# Patient Record
Sex: Male | Born: 1975 | Hispanic: Yes | Smoking: Current every day smoker
Health system: Southern US, Community
[De-identification: ages and names within clinical notes are randomized; demographics above are authoritative.]

## PROBLEM LIST (undated history)

## (undated) DIAGNOSIS — F32A Depression, unspecified: Secondary | ICD-10-CM

## (undated) DIAGNOSIS — M5020 Other cervical disc displacement, unspecified cervical region: Secondary | ICD-10-CM

## (undated) DIAGNOSIS — G709 Myoneural disorder, unspecified: Secondary | ICD-10-CM

## (undated) DIAGNOSIS — F419 Anxiety disorder, unspecified: Secondary | ICD-10-CM

## (undated) HISTORY — DX: Anxiety disorder, unspecified: F41.9

## (undated) HISTORY — DX: Myoneural disorder, unspecified: G70.9

## (undated) HISTORY — DX: Depression, unspecified: F32.A

---

## 2020-08-13 ENCOUNTER — Emergency Department: Payer: Medicare HMO

## 2020-08-13 ENCOUNTER — Encounter: Payer: Self-pay | Admitting: Intensive Care

## 2020-08-13 ENCOUNTER — Other Ambulatory Visit: Payer: Self-pay

## 2020-08-13 ENCOUNTER — Emergency Department
Admission: EM | Admit: 2020-08-13 | Discharge: 2020-08-13 | Disposition: A | Discharge status: Home / Self Care | Payer: Medicare HMO | Attending: Emergency Medicine | Admitting: Emergency Medicine

## 2020-08-13 DIAGNOSIS — F17210 Nicotine dependence, cigarettes, uncomplicated: Secondary | ICD-10-CM | POA: Diagnosis not present

## 2020-08-13 DIAGNOSIS — W19XXXA Unspecified fall, initial encounter: Secondary | ICD-10-CM | POA: Insufficient documentation

## 2020-08-13 DIAGNOSIS — M5416 Radiculopathy, lumbar region: Secondary | ICD-10-CM | POA: Insufficient documentation

## 2020-08-13 DIAGNOSIS — M545 Low back pain, unspecified: Secondary | ICD-10-CM | POA: Diagnosis present

## 2020-08-13 MED ORDER — METHOCARBAMOL 750 MG PO TABS: 750.0000 mg | ORAL_TABLET | Freq: Four times a day (QID) | ORAL | 0 refills | Status: AC | PRN

## 2020-08-13 MED ORDER — DEXAMETHASONE SODIUM PHOSPHATE 10 MG/ML IJ SOLN
10.0000 mg | Freq: Once | INTRAMUSCULAR | Status: AC
  Administered 2020-08-13: 10 mg via INTRAMUSCULAR
  Filled 2020-08-13: qty 1

## 2020-08-13 MED ORDER — METHOCARBAMOL 500 MG PO TABS
750.0000 mg | ORAL_TABLET | Freq: Once | ORAL | Status: AC
  Administered 2020-08-13: 750 mg via ORAL
  Filled 2020-08-13: qty 2

## 2020-08-13 MED ORDER — OXYCODONE-ACETAMINOPHEN 5-325 MG PO TABS
1.0000 | ORAL_TABLET | Freq: Once | ORAL | Status: AC
  Administered 2020-08-13: 1 via ORAL
  Filled 2020-08-13: qty 1

## 2020-08-13 MED ORDER — PREDNISONE 10 MG PO TABS: ORAL_TABLET | ORAL | 0 refills | Status: AC

## 2020-08-13 MED ORDER — ACETAMINOPHEN 325 MG PO TABS
650.0000 mg | ORAL_TABLET | Freq: Once | ORAL | Status: AC
  Administered 2020-08-13: 650 mg via ORAL
  Filled 2020-08-13: qty 2

## 2020-08-13 NOTE — ED Notes (Signed)
Pt states he fell yesterday bc his "leg gave out." Pt c/o lower back pain radiating into left leg. Pt able to ambulate with observed difficulty.

## 2020-08-13 NOTE — ED Triage Notes (Signed)
Patient c/o chronic lower back pain from injury in 2018. Reports driving here from [place] two days ago and his back has been hurting ever since

## 2020-08-13 NOTE — Discharge Instructions (Addendum)
Please take the steroid taper and muscle relaxant as prescribed.  You may also use Tylenol, up to 1000 mg 4 times daily as needed for pain.  You have also been referred to pain management to establish with their practice locally here if you are going to live in this area.  You have also been referred to neurosurgery for follow-up regarding your chronic pain.  Return to the emergency department if you experience any worsening in your symptoms, particularly if you have any loss of control of your bladder or bowels or any fevers.

## 2020-08-15 ENCOUNTER — Emergency Department
Admission: EM | Admit: 2020-08-15 | Discharge: 2020-08-15 | Disposition: A | Discharge status: Home / Self Care | Payer: Medicare HMO | Attending: Emergency Medicine | Admitting: Emergency Medicine

## 2020-08-15 ENCOUNTER — Other Ambulatory Visit: Payer: Self-pay

## 2020-08-15 DIAGNOSIS — F17210 Nicotine dependence, cigarettes, uncomplicated: Secondary | ICD-10-CM | POA: Insufficient documentation

## 2020-08-15 DIAGNOSIS — M545 Low back pain, unspecified: Secondary | ICD-10-CM | POA: Insufficient documentation

## 2020-08-15 DIAGNOSIS — G8929 Other chronic pain: Secondary | ICD-10-CM | POA: Insufficient documentation

## 2020-08-15 MED ORDER — ORPHENADRINE CITRATE 30 MG/ML IJ SOLN
60.0000 mg | INTRAMUSCULAR | Status: AC
  Administered 2020-08-15: 60 mg via INTRAMUSCULAR
  Filled 2020-08-15: qty 2

## 2020-08-15 MED ORDER — OXYCODONE-ACETAMINOPHEN 5-325 MG PO TABS
1.0000 | ORAL_TABLET | Freq: Once | ORAL | Status: AC
  Administered 2020-08-15: 1 via ORAL
  Filled 2020-08-15: qty 1

## 2020-08-15 MED ORDER — KETOROLAC TROMETHAMINE 30 MG/ML IJ SOLN
30.0000 mg | Freq: Once | INTRAMUSCULAR | Status: AC
  Administered 2020-08-15: 30 mg via INTRAMUSCULAR
  Filled 2020-08-15: qty 1

## 2020-08-15 NOTE — ED Provider Notes (Signed)
[facility] Emergency Department Provider Note ____________________________________________  Time seen: 2114  I have reviewed the triage vital signs and the nursing notes.  HISTORY  Chief Complaint  Back Pain   HPI Patrick [name] is a 45 y.o. male returns to the ED for subsequent visit related to his chronic low back pain.  Patient gives a history of a mechanical fall  from a 30 foot height scalp fold some 2 to 3 years prior.  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  Patient reports he recently relocated to the area from [place], and has been out of his prescription Percocet since transferring to the area.  He previously been followed, or according to him, by a local pain management clinic.  He gives the name of the provider and the medical clinic, but Google search does not reveal matching information.  Patient was seen in the ED 3 days prior for the same complaint, at that time an MRI was performed which did not reveal any remote fractures, nerve impingement or any acute disc herniation.   History reviewed. No pertinent past medical history.  There are no problems to display for this patient.  History reviewed. No pertinent surgical history.  Prior to Admission medications   Medication Sig Start Date End Date Taking? Authorizing Provider  methocarbamol (ROBAXIN-750) 750 MG tablet Take 1 tablet (750 mg total) by mouth 4 (four) times daily as needed for up to 10 days for muscle spasms. 08/13/20 08/23/20  Lucy Chris, PA  predniSONE (DELTASONE) 10 MG tablet Take 6 tablets (60 mg total) by mouth daily for 1 day, THEN 5 tablets (50 mg total) daily for 1 day, THEN 4 tablets (40 mg total) daily for 1 day, THEN 3 tablets (30 mg total) daily for 1 day, THEN 2 tablets (20 mg total) daily for 1 day, THEN 1 tablet (10 mg total) daily for 1 day. 08/13/20 08/19/20  Lucy Chris, PA    Allergies Patient  has no known allergies.  History reviewed. No pertinent family history.  Social History Social History   Tobacco Use  . Smoking status: Current Every Day Smoker    Types: Cigarettes  . Smokeless tobacco: Never Used  Substance Use Topics  . Alcohol use: Never  . Drug use: Never    Review of Systems  Constitutional: Negative for fever. Eyes: Negative for visual changes. ENT: Negative for sore throat. Cardiovascular: Negative for chest pain. Respiratory: Negative for shortness of breath. Gastrointestinal: Negative for abdominal pain, vomiting and diarrhea. Genitourinary: Negative for dysuria. Musculoskeletal: Positive for back pain. Skin: Negative for rash. Neurological: Negative for headaches, focal weakness or numbness. ____________________________________________  PHYSICAL EXAM:  VITAL SIGNS: ED Triage Vitals  Enc Vitals Group     BP 08/15/20 2040 119/79     Pulse Rate 08/15/20 2040 99     Resp 08/15/20 2040 18     Temp 08/15/20 2040 97.9 F (36.6 C)     Temp Source 08/15/20 2040 Oral     SpO2 08/15/20 2040 98 %     Weight 08/15/20 2039 185 lb (83.9 kg)     Height 08/15/20 2039 6\' 2"  (1.88 m)     Head Circumference --      Peak Flow --      Pain Score 08/15/20 2038 10     Pain Loc --      Pain Edu? --      Excl.  in GC? --     Constitutional: Alert and oriented. Well appearing and in no distress. Head: Normocephalic and atraumatic. Eyes: Conjunctivae are normal. Normal extraocular movements Cardiovascular: Normal rate, regular rhythm. Normal distal pulses. Respiratory: Normal respiratory effort. No wheezes/rales/rhonchi. Gastrointestinal: Soft and nontender. No distention. Musculoskeletal: Patient with normal flexion and extension range to the hips bilaterally.  There is some intentional resistance to passive range of motion on the left lower extremity.  Nontender with normal range of motion in all extremities.  Neurologic: Cranial nerves II through XII  grossly intact.  Normal LE DTRs bilaterally.  Negative supine straight leg raise bilaterally.  Normal toe dorsiflexion and foot eversion.  Normal speech and language. No gross focal neurologic deficits are appreciated. Skin:  Skin is warm, dry and intact. No rash noted. ____________________________________________  PROCEDURES  Toradol 30 mg IM Norflex 60 mg IM Oxycodone 5-3 25 p.o. x 1  Procedures ____________________________________________  RADIOLOGY  MRI LUMBAR SPINE (08/13/20)  IMPRESSION: 1. Small left extraforaminal disc protrusion at L3-L4 with annular fissure in close proximity to the exiting L3 nerve root. 2. Small bulge at L4-L5 with annular fissure in close proximity to the foraminal portion of the left L4 nerve root. 3. No spinal canal stenosis. ________________________________________________  INITIAL IMPRESSION / ASSESSMENT AND PLAN / ED COURSE  As part of my medical decision making, I reviewed the following data within the electronic MEDICAL RECORD NUMBER Notes from prior ED visits and [place] Controlled Substance Database  Patient with subsequent ED visit for chronic low back pain.  Patient presents today with request for pain medicine.  Exam today is benign and reassuring as it shows no acute neuromuscular deficit.  No red flags on exam.  We were not able to verify his history of narcotic pain management through [facility] based pain management clinic.  We were also not able to find any documentation of recurrent prescriptions based on his report.  Patient was treated at this time with an injection of Toradol and Norflex.  He was also given a single dose of Percocet in the ED.  Patient was advised that the emergency departments chronic pain management policy.  Patient is again referred to [facility] pain management clinic for further management of his symptoms.  Return precautions have been discussed.  Savion Washam was evaluated in Emergency Department on 08/15/2020 for the symptoms  described in the history of present illness. He was evaluated in the context of the global COVID-19 pandemic, which necessitated consideration that the patient might be at risk for infection with the SARS-CoV-2 virus that causes COVID-19. Institutional protocols and algorithms that pertain to the evaluation of patients at risk for COVID-19 are in a state of rapid change based on information released by regulatory bodies including the CDC and federal and state organizations. These policies and algorithms were followed during the patient's care in the ED.  I reviewed the patient's prescription history over the last 12 months in the multi-state controlled substances database(s) that includes [place], [place], [place], [place], [place], [place], [place], [place], [place], [place], [place], [place], [place], and [place].  Results were notable for no RX history despite patient report of monthly narcotic RXs.  ____________________________________________  FINAL CLINICAL IMPRESSION(S) / ED DIAGNOSES  Final diagnoses:  Chronic midline low back pain without sciatica      Karmen Stabs, Charlesetta Ivory, PA-C 08/15/20 2332    Phineas Semen, MD 08/15/20 [phone]

## 2020-08-15 NOTE — ED Triage Notes (Signed)
Pt states 2 years ago he fell 26ft and has since had chronic back pain, pt was prescribed percocet's but is now out of the prescription, pt states when he was taking them they were controlling his pain well.

## 2020-08-15 NOTE — ED Provider Notes (Signed)
[facility] Emergency Department Provider Note  ____________________________________________   Event Date/Time   First MD Initiated Contact with Patient 08/13/20 1639     (approximate)  I have reviewed the triage vital signs and the nursing notes.   HISTORY  Chief Complaint Back Pain  HPI Patrick [name] is a 45 y.o. male who presents to the emergency department for evaluation of low back pain.  Patient states that he has injury in 2018 in which he fell 30 feet, fractured multiple lumbar vertebrae and states that he has 7 disc herniation in his back from this fall.  He was treated in [place], was on a pain contract with a provider there for opioids.  Patient states that he was doing well, states he had not had opioids for 1 month and moved 2 days ago to the area to stay with his sister.  He reports that during the long car ride, his back pain acutely worsened.  He states that he has been having multiple falls due to weakness in his left lower extremity.  He denies any current loss of bowel or bladder control, but does endorse this with the initial injury.  He reports most recent fall was yesterday.  All symptoms are from the low back down the left leg, none down the right.  He denies any recent fevers or other illness symptoms.  Denies any urinary symptoms.       History reviewed. No pertinent past medical history.  There are no problems to display for this patient.   History reviewed. No pertinent surgical history.  Prior to Admission medications   Medication Sig Start Date End Date Taking? Authorizing Provider  methocarbamol (ROBAXIN-750) 750 MG tablet Take 1 tablet (750 mg total) by mouth 4 (four) times daily as needed for up to 10 days for muscle spasms. 08/13/20 08/23/20 Yes Fontella Shan, Ruben Gottron, PA  predniSONE (DELTASONE) 10 MG tablet Take 6 tablets (60 mg total) by mouth daily for 1 day, THEN 5 tablets (50 mg total) daily for 1 day, THEN 4 tablets (40 mg total)  daily for 1 day, THEN 3 tablets (30 mg total) daily for 1 day, THEN 2 tablets (20 mg total) daily for 1 day, THEN 1 tablet (10 mg total) daily for 1 day. 08/13/20 08/19/20 Yes Lucy Chris, PA    Allergies Patient has no known allergies.  History reviewed. No pertinent family history.  Social History Social History   Tobacco Use  . Smoking status: Current Every Day Smoker    Types: Cigarettes  . Smokeless tobacco: Never Used  Substance Use Topics  . Alcohol use: Never  . Drug use: Never    Review of Systems Constitutional: No fever/chills Eyes: No visual changes. ENT: No sore throat. Cardiovascular: Denies chest pain. Respiratory: Denies shortness of breath. Gastrointestinal: No abdominal pain.  No nausea, no vomiting.  No diarrhea.  No constipation. Genitourinary: Negative for dysuria. Musculoskeletal: +back pain. Skin: Negative for rash. Neurological: Negative for headaches, focal weakness or numbness.   ____________________________________________   PHYSICAL EXAM:  VITAL SIGNS: ED Triage Vitals  Enc Vitals Group     BP 08/13/20 1527 (!) 119/94     Pulse Rate 08/13/20 1527 99     Resp 08/13/20 1527 16     Temp 08/13/20 1527 97.6 F (36.4 C)     Temp Source 08/13/20 1527 Oral     SpO2 08/13/20 1527 100 %     Weight 08/13/20 1528 185 lb (83.9 kg)  Height 08/13/20 1528 6\' 2"  (1.88 m)     Head Circumference --      Peak Flow --      Pain Score 08/13/20 1527 10     Pain Loc --      Pain Edu? --      Excl. in GC? --    Constitutional: Alert and oriented. Well appearing and in no acute distress. Eyes: Conjunctivae are normal. PERRL. EOMI. Head: Atraumatic. Nose: No congestion/rhinnorhea. Mouth/Throat: Mucous membranes are moist.  Oropharynx non-erythematous. Neck: No stridor.   Cardiovascular: Normal rate, regular rhythm. Grossly normal heart sounds.  Good peripheral circulation. Respiratory: Normal respiratory effort.  No retractions. Lungs  CTAB. Gastrointestinal: Soft and nontender. No distention. No abdominal bruits. No CVA tenderness. Musculoskeletal: There is tenderness to palpation of the midline of the lumbar spine into the left SI joint.  Patient has 4/5 strength in ankle plantarflexion and dorsiflexion, knee flexion and extension on the left.  He has 5/5 strength in all motions on the right.  DTRs are 2+ and intact.  Dorsal pedal pulses 2+ bilaterally. Neurologic:  Normal speech and language. No gross focal neurologic deficits are appreciated.  Antalgic gait secondary to reported back pain Skin:  Skin is warm, dry and intact. No rash noted. Psychiatric: Mood and affect are normal. Speech and behavior are normal.  ____________________________________________  RADIOLOGY   MRI read by radiology as negative for acute cauda equina, does report small disc extrusion at L3-L4 and small fissure at L4-L5  ____________________________________________   INITIAL IMPRESSION / ASSESSMENT AND PLAN / ED COURSE  As part of my medical decision making, I reviewed the following data within the electronic MEDICAL RECORD NUMBER Nursing notes reviewed and incorporated, Notes from prior ED visits and [place] Controlled Substance Database       Patient is a 45 year old male who presents to the emergency department for acute low back pain with left-sided weakness that has worsened over the last 2 days.  States he had an injury in 2018 that initiated, however he reports worsening over the last couple days.  See HPI for further details.  In triage, patient has grossly normal vital signs.  Physical exam as above, with note of left-sided weakness at 4/5 in all nerve distributions.  MRI was obtained given this weakness that was reportedly changed and no comparison physical exams in the system to review.  MRI is negative for acute cauda equina, does demonstrate small protrusion on the left L3-L4 and small fissure at L4-L5.  Recommended follow-up on outpatient  basis with neurosurgery.  In the interim, did provide the patient a steroid taper as well as muscle relaxant for his symptoms.  Return precautions were discussed and patient stable at this time for outpatient management.      ____________________________________________   FINAL CLINICAL IMPRESSION(S) / ED DIAGNOSES  Final diagnoses:  Lumbar radiculopathy     ED Discharge Orders         Ordered    predniSONE (DELTASONE) 10 MG tablet        08/13/20 1904    methocarbamol (ROBAXIN-750) 750 MG tablet  4 times daily PRN        08/13/20 1904          *Please note:  Czar Ysaguirre was evaluated in Emergency Department on 08/15/2020 for the symptoms described in the history of present illness. He was evaluated in the context of the global COVID-19 pandemic, which necessitated consideration that the patient might be at risk for  infection with the SARS-CoV-2 virus that causes COVID-19. Institutional protocols and algorithms that pertain to the evaluation of patients at risk for COVID-19 are in a state of rapid change based on information released by regulatory bodies including the CDC and federal and state organizations. These policies and algorithms were followed during the patient's care in the ED.  Some ED evaluations and interventions may be delayed as a result of limited staffing during and the pandemic.*   Note:  This document was prepared using Dragon voice recognition software and may include unintentional dictation errors.   Lucy Chris, PA 08/15/20 [facility]    Shaune Pollack, MD 08/18/20 [phone]

## 2020-08-15 NOTE — Discharge Instructions (Signed)
You have been treated with a pain pill, and and injectable anti-inflammatory and muscle relaxant in the ED for your chronic pain. You should make contact with your previous pain management provider or contact  [facility] to establish new patient care. SEE THE EMERGENCY DEPARTMENT PAIN MANAGEMENT POLICY below:  Emergency care providers appreciate that many patients coming to [place] are in severe pain and we wish to address their pain in the safest, most responsible manner.  It is important to recognize, however, that the proper treatment of chronic pain differs from that of the pain of injuries and acute illnesses.  Our goal is to provider quality, safe, personalized care and we thank you for giving [place] the opportunity to serve you.  The use of narcotics and related agents for chronic pain syndromes may lead to additional physical and psychological problems.  Nearly as many people die from prescription narcotics each year as die from car crashes.  Additionally, this risk is increased if such prescriptions are obtained from a variety of sources.  Therefore, only your primary care physician or a pain management specialist is able to safely treat such syndromes with narcotic medications long-term.  Documentation revealing such prescriptions have been sought from multiple sources may prohibit [place] from providing a refill or different narcotic medication.  Your name may be checked first through the [facility] Controlled Substances Reporting System.  This database is a record of controlled substance medication prescriptions that the patient has received.  This has been established by [facility] in an effort to eliminate the dangerous, and often life-threatening, practice of obtaining multiple prescriptions from different medical providers.  If you have a chronic pain syndrome (i.e. chronic headaches, recurrent back or neck pain, dental pain, abdominal or pelvic pain without a specific  diagnosis, or neuropathic pain such as fibromyalgia) or recurrent visits for the same condition without an acute diagnosis, you may be treated with non-narcotics and other non-addictive medicines.  Allergic reactions or negative side effects that may be reported by a patient to such medications will not typically lead to the use of a narcotic analgesic or other controlled substance as an alternative.  Patients managing chronic pain with a personal physician should have provisions in place for breakthrough pain.  If you are in crisis, you should call your physician.  If your physician directs you to the emergency department, please have the doctor call and speak to our attending physician concerning your care.  When patients come to the Emergency Department (ED) with acute medical conditions in which the ED physician feels it is appropriate to prescribe narcotic or sedating pain medication, the physician will prescribe these in very limited quantities.  The amount of these medications will last only until you can see your primary care physician in his/her office.  Any patient who returns to the ED seeking refills should expect only non-narcotic pain medications.  In the event an acute medical condition exists and the emergency physician feels it is necessary that the patient be given a narcotic or sedating medication, a responsible adult driver should be present in the room prior to the medication being given by the nurse.  Prescriptions for narcotic or sedating medications that have been lost, stolen, or expired will NOT be refilled in the ED.  Patients who have chronic pain may receive non-narcotic prescriptions until seen by their primary care physician.  It is every patient's personal responsibility to maintain active prescriptions with his or her primary care physician or specialist.

## 2020-09-02 ENCOUNTER — Emergency Department
Admission: EM | Admit: 2020-09-02 | Discharge: 2020-09-02 | Discharge status: Against Medical Advice | Payer: Medicare HMO

## 2020-09-02 NOTE — ED Notes (Signed)
Attempted to call pt on phone for triage x2 without answer. Pt not in lobby, no answer when called for triage x2.

## 2020-09-06 ENCOUNTER — Other Ambulatory Visit: Payer: Self-pay

## 2020-09-06 DIAGNOSIS — G8929 Other chronic pain: Secondary | ICD-10-CM | POA: Insufficient documentation

## 2020-09-06 DIAGNOSIS — F17210 Nicotine dependence, cigarettes, uncomplicated: Secondary | ICD-10-CM | POA: Insufficient documentation

## 2020-09-06 DIAGNOSIS — Z5321 Procedure and treatment not carried out due to patient leaving prior to being seen by health care provider: Secondary | ICD-10-CM | POA: Insufficient documentation

## 2020-09-06 DIAGNOSIS — M545 Low back pain, unspecified: Secondary | ICD-10-CM | POA: Insufficient documentation

## 2020-09-06 NOTE — ED Triage Notes (Signed)
Pt states he has hx of chronic back pain and was helping mother move today. States now pain worse to mid and lower back.

## 2020-09-07 ENCOUNTER — Emergency Department
Admission: EM | Admit: 2020-09-07 | Discharge: 2020-09-07 | Disposition: A | Discharge status: Home / Self Care | Payer: Medicare HMO | Attending: Emergency Medicine | Admitting: Emergency Medicine

## 2020-09-07 ENCOUNTER — Emergency Department
Admission: EM | Admit: 2020-09-07 | Discharge: 2020-09-07 | Disposition: A | Discharge status: Home / Self Care | Payer: Medicare HMO | Source: Home / Self Care | Attending: Emergency Medicine | Admitting: Emergency Medicine

## 2020-09-07 ENCOUNTER — Encounter: Payer: Self-pay | Admitting: Emergency Medicine

## 2020-09-07 ENCOUNTER — Other Ambulatory Visit: Payer: Self-pay

## 2020-09-07 DIAGNOSIS — F17210 Nicotine dependence, cigarettes, uncomplicated: Secondary | ICD-10-CM | POA: Insufficient documentation

## 2020-09-07 DIAGNOSIS — M545 Low back pain, unspecified: Secondary | ICD-10-CM | POA: Insufficient documentation

## 2020-09-07 MED ORDER — DIAZEPAM 2 MG PO TABS: 2.0000 mg | ORAL_TABLET | Freq: Three times a day (TID) | ORAL | 0 refills | Status: DC | PRN

## 2020-09-07 MED ORDER — KETOROLAC TROMETHAMINE 60 MG/2ML IM SOLN
30.0000 mg | Freq: Once | INTRAMUSCULAR | Status: AC
  Administered 2020-09-07: 30 mg via INTRAMUSCULAR
  Filled 2020-09-07: qty 2

## 2020-09-07 MED ORDER — OXYCODONE-ACETAMINOPHEN 5-325 MG PO TABS
2.0000 | ORAL_TABLET | Freq: Once | ORAL | Status: AC
  Administered 2020-09-07: 2 via ORAL
  Filled 2020-09-07: qty 2

## 2020-09-07 MED ORDER — LIDOCAINE 5 % EX PTCH: 1.0000 | MEDICATED_PATCH | CUTANEOUS | 0 refills | Status: DC

## 2020-09-07 MED ORDER — IBUPROFEN 800 MG PO TABS: 800.0000 mg | ORAL_TABLET | Freq: Three times a day (TID) | ORAL | 0 refills | Status: DC | PRN

## 2020-09-07 MED ORDER — OXYCODONE-ACETAMINOPHEN 10-325 MG PO TABS: 1.0000 | ORAL_TABLET | Freq: Four times a day (QID) | ORAL | 0 refills | Status: DC | PRN

## 2020-09-07 MED ORDER — DIAZEPAM 2 MG PO TABS
2.0000 mg | ORAL_TABLET | Freq: Once | ORAL | Status: AC
  Administered 2020-09-07: 2 mg via ORAL
  Filled 2020-09-07: qty 1

## 2020-09-07 MED ORDER — LIDOCAINE 5 % EX PTCH
1.0000 | MEDICATED_PATCH | CUTANEOUS | Status: DC
  Administered 2020-09-07: 1 via TRANSDERMAL
  Filled 2020-09-07: qty 1

## 2020-09-07 NOTE — ED Triage Notes (Addendum)
Patient ambulatory to triage with steady gait, without difficulty or distress noted; pt here few hrs earlier but left before being seen due to long wait; returns for persistent lower back pain, hx of chronic back pain and has been seen here for same recently; denies any accomp symptom

## 2020-09-07 NOTE — ED Provider Notes (Signed)
[facility] Emergency Department Provider Note   ____________________________________________   Event Date/Time   First MD Initiated Contact with Patient 09/07/20 0424     (approximate)  I have reviewed the triage vital signs and the nursing notes.   HISTORY  Chief Complaint Back Pain    HPI Patrick [name] is a 45 y.o. male who presents to the ED from home with acute on chronic back pain. Patient has a history of herniated disc, chronic back pain who has not been on his 10mg  Percocet or Xanax/Lorazepam for some time. Helped his mother with yard work last week and complains of lower back pain. Denies extremity weakness, numbness/tingling, bowel or bladder incontinence.     Past Medical History Chronic back pain  There are no problems to display for this patient.   History reviewed. No pertinent surgical history.  Prior to Admission medications   Not on File    Allergies Ativan [lorazepam]  No family history on file.  Social History Social History   Tobacco Use  . Smoking status: Current Every Day Smoker    Types: Cigarettes  . Smokeless tobacco: Never Used  Vaping Use  . Vaping Use: Never used  Substance Use Topics  . Alcohol use: Never  . Drug use: Never    Review of Systems  Constitutional: No fever/chills Eyes: No visual changes. ENT: No sore throat. Cardiovascular: Denies chest pain. Respiratory: Denies shortness of breath. Gastrointestinal: No abdominal pain.  No nausea, no vomiting.  No diarrhea.  No constipation. Genitourinary: Negative for dysuria. Musculoskeletal: Positive for back pain. Skin: Negative for rash. Neurological: Negative for headaches, focal weakness or numbness.   ____________________________________________   PHYSICAL EXAM:  VITAL SIGNS: ED Triage Vitals [09/07/20 0300]  Enc Vitals Group     BP 110/71     Pulse Rate 92     Resp 20     Temp 98.5 F (36.9 C)     Temp Source Oral     SpO2  98 %     Weight 190 lb (86.2 kg)     Height 6\' 2"  (1.88 m)     Head Circumference      Peak Flow      Pain Score 10     Pain Loc      Pain Edu?      Excl. in GC?     Constitutional: Alert and oriented. Well appearing and in no acute distress. Eyes: Conjunctivae are normal. PERRL. EOMI. Head: Atraumatic. Nose: No congestion/rhinnorhea. Mouth/Throat: Mucous membranes are moist.   Neck: No stridor.  No cervical spine tenderness to palpation. Cardiovascular: Normal rate, regular rhythm. Grossly normal heart sounds.  Good peripheral circulation. Respiratory: Normal respiratory effort.  No retractions. Lungs CTAB. Gastrointestinal: Soft and nontender. No distention. No abdominal bruits. No CVA tenderness. Musculoskeletal: No spinal tenderness to palpation. Bilateral lumbar paraspinal muscle spasms. Negative SLR bilaterally. No lower extremity tenderness nor edema.  No joint effusions. Neurologic:  Normal speech and language. No gross focal neurologic deficits are appreciated. No gait instability. Skin:  Skin is warm, dry and intact. No rash noted. Psychiatric: Mood and affect are normal. Speech and behavior are normal.  ____________________________________________   LABS (all labs ordered are listed, but only abnormal results are displayed)  Labs Reviewed - No data to display ____________________________________________  EKG  None ____________________________________________  RADIOLOGY I, Rontavious Albright J, personally viewed and evaluated these images (plain radiographs) as part of my medical decision making, as well as reviewing the  written report by the radiologist.  ED MD interpretation:  None  Official radiology report(s): No results found.  ____________________________________________   PROCEDURES  Procedure(s) performed (including Critical Care):  Procedures   ____________________________________________   INITIAL IMPRESSION / ASSESSMENT AND PLAN / ED COURSE  As  part of my medical decision making, I reviewed the following data within the electronic MEDICAL RECORD NUMBER Nursing notes reviewed and incorporated, Old chart reviewed, Radiograph reviewed, Notes from prior ED visits and [place] Controlled Substance Database     45 year old male presenting with acute on chronic lower back pain. Will administer IM Toradol, Lidoderm patch, and Valium. Reviewed PDMP and patient had #90 (30 day supply) of Percocet filled on 08/16/2020. Patient will follow up with orthopedics as needed. Strict return precautions given. Patient verbalizes understanding and agrees with plan of care.      ____________________________________________   FINAL CLINICAL IMPRESSION(S) / ED DIAGNOSES  Final diagnoses:  Acute midline low back pain without sciatica     ED Discharge Orders    None       Note:  This document was prepared using Dragon voice recognition software and may include unintentional dictation errors.   Irean Hong, MD 09/07/20 [phone]

## 2020-09-07 NOTE — Discharge Instructions (Signed)
You may take medicines as needed for pain and muscle spasms (Motrin/Percocet/Valium #15). Apply Lidoderm patch to affected area. Return to the ER for worsening symptoms, persistent vomiting, difficulty breathing or other concerns.

## 2020-09-19 ENCOUNTER — Emergency Department: Payer: Medicare HMO

## 2020-09-19 ENCOUNTER — Emergency Department
Admission: EM | Admit: 2020-09-19 | Discharge: 2020-09-19 | Disposition: A | Discharge status: Home / Self Care | Payer: Medicare HMO | Attending: Emergency Medicine | Admitting: Emergency Medicine

## 2020-09-19 ENCOUNTER — Other Ambulatory Visit: Payer: Self-pay

## 2020-09-19 DIAGNOSIS — M62830 Muscle spasm of back: Secondary | ICD-10-CM

## 2020-09-19 DIAGNOSIS — F17210 Nicotine dependence, cigarettes, uncomplicated: Secondary | ICD-10-CM | POA: Diagnosis not present

## 2020-09-19 DIAGNOSIS — M62838 Other muscle spasm: Secondary | ICD-10-CM | POA: Diagnosis not present

## 2020-09-19 DIAGNOSIS — M5416 Radiculopathy, lumbar region: Secondary | ICD-10-CM | POA: Insufficient documentation

## 2020-09-19 DIAGNOSIS — Y990 Civilian activity done for income or pay: Secondary | ICD-10-CM | POA: Insufficient documentation

## 2020-09-19 DIAGNOSIS — M545 Low back pain, unspecified: Secondary | ICD-10-CM | POA: Diagnosis present

## 2020-09-19 DIAGNOSIS — X500XXA Overexertion from strenuous movement or load, initial encounter: Secondary | ICD-10-CM | POA: Insufficient documentation

## 2020-09-19 DIAGNOSIS — M5126 Other intervertebral disc displacement, lumbar region: Secondary | ICD-10-CM | POA: Diagnosis not present

## 2020-09-19 MED ORDER — ORPHENADRINE CITRATE 30 MG/ML IJ SOLN
60.0000 mg | Freq: Once | INTRAMUSCULAR | Status: AC
  Administered 2020-09-19: 60 mg via INTRAMUSCULAR
  Filled 2020-09-19: qty 2

## 2020-09-19 MED ORDER — KETOROLAC TROMETHAMINE 60 MG/2ML IM SOLN
30.0000 mg | Freq: Once | INTRAMUSCULAR | Status: AC
  Administered 2020-09-19: 30 mg via INTRAMUSCULAR
  Filled 2020-09-19: qty 2

## 2020-09-19 MED ORDER — CYCLOBENZAPRINE HCL 5 MG PO TABS: 5.0000 mg | ORAL_TABLET | Freq: Three times a day (TID) | ORAL | 0 refills | Status: DC | PRN

## 2020-09-19 MED ORDER — OXYCODONE-ACETAMINOPHEN 5-325 MG PO TABS
1.0000 | ORAL_TABLET | Freq: Once | ORAL | Status: AC
  Administered 2020-09-19: 1 via ORAL
  Filled 2020-09-19: qty 1

## 2020-09-19 MED ORDER — GABAPENTIN 300 MG PO CAPS: 300.0000 mg | ORAL_CAPSULE | Freq: Every day | ORAL | 0 refills | Status: DC

## 2020-09-19 NOTE — ED Provider Notes (Signed)
[facility] EMERGENCY DEPARTMENT Provider Note   CSN: [id number] Arrival date & time: 09/19/20  1342     History Chief Complaint  Patient presents with   Back Pain    Patrick [name] is a 45 y.o. male presents to the emergency department evaluation of acute back pain.  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  Him and another guy were lifting a heavy pole when the other guy dropped his and then all the weight went onto the patient's and of the pole.  He states he felt pain and a popping in his back with numbness and tingling down the left leg.  He is ambulatory and is continue to work.  Denies any weakness or loss of bowel or bladder symptoms.  He has a history of chronic back pain and has been seen numerous times over the last few weeks in the ER.  A recent MRI showed lumbar disc protrusions with possible irritation of the exiting left L3 and L4 nerves.  He has been taking Aleve over the last 6 days with no relief.  HPI     History reviewed. No pertinent past medical history.  There are no problems to display for this patient.   History reviewed. No pertinent surgical history.     No family history on file.  Social History   Tobacco Use   Smoking status: Every Day    Pack years: 0.00    Types: Cigarettes   Smokeless tobacco: Never  Vaping Use   Vaping Use: Never used  Substance Use Topics   Alcohol use: Never   Drug use: Never    Home Medications Prior to Admission medications   Medication Sig Start Date End Date Taking? Authorizing Provider  cyclobenzaprine (FLEXERIL) 5 MG tablet Take 1-2 tablets (5-10 mg total) by mouth 3 (three) times daily as needed for muscle spasms. 09/19/20  Yes Evon Slack, PA-C  gabapentin (NEURONTIN) 300 MG capsule Take 1 capsule (300 mg total) by mouth at bedtime. 09/19/20 09/19/21 Yes Evon Slack, PA-C  diazepam (VALIUM) 2 MG tablet Take 1 tablet (2 mg  total) by mouth every 8 (eight) hours as needed for muscle spasms. 09/07/20   Irean Hong, MD  ibuprofen (ADVIL) 800 MG tablet Take 1 tablet (800 mg total) by mouth every 8 (eight) hours as needed for moderate pain. 09/07/20   Irean Hong, MD  lidocaine (LIDODERM) 5 % Place 1 patch onto the skin daily. Remove & Discard patch within 12 hours or as directed by MD 09/07/20   Irean Hong, MD    Allergies    Ativan [lorazepam]  Review of Systems   Review of Systems  Constitutional:  Negative for chills, fatigue and fever.  Respiratory:  Negative for choking and shortness of breath.   Cardiovascular:  Negative for chest pain.  Gastrointestinal:  Negative for abdominal pain, nausea and vomiting.  Musculoskeletal:  Positive for back pain and myalgias. Negative for joint swelling and neck pain.  Skin:  Negative for rash and wound.  Neurological:  Negative for dizziness, numbness and headaches.   Physical Exam Updated [name] Signs BP 109/84   Pulse 91   Temp 98.6 F (37 C) (Oral)   Resp 18   Ht 6\' 2"  (1.88 m)   Wt 86.2 kg   SpO2 96%   BMI 24.39 kg/m     ED Results / Procedures /  Treatments   Labs (all labs ordered are listed, but only abnormal results are displayed) Labs Reviewed - No data to display  EKG None  Radiology DG Thoracic Spine 2 View  Result Date: 09/19/2020 CLINICAL DATA:  45 year old male with back pain. EXAM: LUMBAR SPINE - 2-3 VIEW; THORACIC SPINE 2 VIEWS COMPARISON:  Lumbar spine MRI dated 08/13/2020. FINDINGS: There is no acute fracture or subluxation of the thoracic or lumbar spine. There are 5 lumbar type vertebra. The vertebral body heights and disc spaces are maintained. The visualized posterior elements are intact. The soft tissues are unremarkable. IMPRESSION: Negative. Electronically Signed   By: Elgie Collard M.D.   On: 09/19/2020 16:16   DG Lumbar Spine 2-3 Views  Result Date: 09/19/2020 CLINICAL DATA:  45 year old male with back pain. EXAM: LUMBAR  SPINE - 2-3 VIEW; THORACIC SPINE 2 VIEWS COMPARISON:  Lumbar spine MRI dated 08/13/2020. FINDINGS: There is no acute fracture or subluxation of the thoracic or lumbar spine. There are 5 lumbar type vertebra. The vertebral body heights and disc spaces are maintained. The visualized posterior elements are intact. The soft tissues are unremarkable. IMPRESSION: Negative. Electronically Signed   By: Elgie Collard M.D.   On: 09/19/2020 16:16    Procedures Procedures   Medications Ordered in ED Medications  oxyCODONE-acetaminophen (PERCOCET/ROXICET) 5-325 MG per tablet 1 tablet (1 tablet Oral Given 09/19/20 1530)  ketorolac (TORADOL) injection 30 mg (30 mg Intramuscular Given 09/19/20 1530)  orphenadrine (NORFLEX) injection 60 mg (60 mg Intramuscular Given 09/19/20 1530)    ED Course  I have reviewed the triage [name] signs and the nursing notes.  Pertinent labs & imaging results that were available during my care of the patient were reviewed by me and considered in my medical decision making (see chart for details).    MDM Rules/Calculators/A&P                          45 year old male presents with acute on chronic back pain.  He has some left lumbar radiculopathy symptoms, recent MRI showed possible left L4 and L5 nerve impingement.  Patient suffered a new injury 6 days ago where he felt pain in the midline of his lower back after lifting a heavy object, x-rays of the thoracic and lumbar spine negative today.  He does not have any new radicular symptoms down the left leg with no weakness or neurological deficits on exam.  Patient with history of chronic back pain requiring oxycodone in [place].  He does not have pain management here.  Discussed importance of him following up with pain clinic here to take over his medications.  We will not be prescribing him narcotics today.  Patient prescribed Flexeril, gabapentin and will take over-the-counter anti-inflammatory medications.  He understands signs  symptoms return to the ER for. Final Clinical Impression(s) / ED Diagnoses Final diagnoses:  Protrusion of lumbar intervertebral disc  Left lumbar radiculopathy  Lumbar paraspinal muscle spasm    Rx / DC Orders ED Discharge Orders          Ordered    cyclobenzaprine (FLEXERIL) 5 MG tablet  3 times daily PRN        09/19/20 1702    gabapentin (NEURONTIN) 300 MG capsule  Daily at bedtime        09/19/20 1702             Ronnette Juniper 09/19/20 1706    Phineas Semen, MD 09/19/20 1851

## 2020-09-19 NOTE — ED Notes (Signed)
See triage note  Presents with lower back pain  States pain started after lifting on Friday  Ambulates well

## 2020-09-19 NOTE — ED Triage Notes (Signed)
Pt states he was lifting something last Friday and since having lower back pain.

## 2020-09-19 NOTE — Discharge Instructions (Addendum)
Please take medications as prescribed.  Perform gentle stretching exercises to loosen the muscles in the back.  Please call pain clinic orthopedic office to schedule follow-up appointment.  Return to the ER for any worsening symptoms or urgent changes in your health such as weakness or loss of bowel or bladder symptoms.

## 2020-10-09 ENCOUNTER — Encounter: Payer: Self-pay | Admitting: Emergency Medicine

## 2020-10-09 ENCOUNTER — Other Ambulatory Visit: Payer: Self-pay

## 2020-10-09 ENCOUNTER — Emergency Department
Admission: EM | Admit: 2020-10-09 | Discharge: 2020-10-09 | Disposition: A | Discharge status: Home / Self Care | Payer: Medicare HMO | Attending: Emergency Medicine | Admitting: Emergency Medicine

## 2020-10-09 DIAGNOSIS — G8929 Other chronic pain: Secondary | ICD-10-CM | POA: Insufficient documentation

## 2020-10-09 DIAGNOSIS — F17210 Nicotine dependence, cigarettes, uncomplicated: Secondary | ICD-10-CM | POA: Insufficient documentation

## 2020-10-09 DIAGNOSIS — M545 Low back pain, unspecified: Secondary | ICD-10-CM | POA: Diagnosis present

## 2020-10-09 HISTORY — DX: Other cervical disc displacement, unspecified cervical region: M50.20

## 2020-10-09 MED ORDER — KETOROLAC TROMETHAMINE 30 MG/ML IJ SOLN
30.0000 mg | Freq: Once | INTRAMUSCULAR | Status: AC
  Administered 2020-10-09: 30 mg via INTRAMUSCULAR
  Filled 2020-10-09: qty 1

## 2020-10-09 MED ORDER — LIDOCAINE 5 % EX PTCH: 1.0000 | MEDICATED_PATCH | CUTANEOUS | 0 refills | Status: DC

## 2020-10-09 MED ORDER — NAPROXEN 500 MG PO TABS: 500.0000 mg | ORAL_TABLET | Freq: Two times a day (BID) | ORAL | 0 refills | Status: DC

## 2020-10-09 MED ORDER — BACLOFEN 5 MG PO TABS: 1.0000 | ORAL_TABLET | Freq: Two times a day (BID) | ORAL | 0 refills | Status: DC

## 2020-10-09 NOTE — ED Provider Notes (Signed)
[facility] Emergency Department Provider Note  ____________________________________________   Event Date/Time   First MD Initiated Contact with Patient 10/09/20 1057     (approximate)  I have reviewed the triage vital signs and the nursing notes.   HISTORY  Chief Complaint Back Pain   HPI Patrick [name] is a 45 y.o. male is here with complaint of chronic back pain which is not improving with Flexeril and gabapentin.  He reports that the Flexeril does not help with muscle spasms that the gabapentin is making him sleepy.  He has been seen in the emergency department prior.  He denies any recent injury to his back.  On his last visit to the ED thoracic and lumbar spine x-rays were negative other than degenerative changes.  Patient states that he is not followed up with anybody at this point and is having difficulty finding a primary care provider.  He denies any paresthesias or incontinence of bowel or bladder.  Currently rates his pain as 10/10.       Past Medical History:  Diagnosis Date   Herniated cervical disc     There are no problems to display for this patient.   History reviewed. No pertinent surgical history.  Prior to Admission medications   Medication Sig Start Date End Date Taking? Authorizing Provider  Baclofen 5 MG TABS Take 1 tablet by mouth 2 (two) times daily. 10/09/20  Yes Tommi Rumps, PA-C  naproxen (NAPROSYN) 500 MG tablet Take 1 tablet (500 mg total) by mouth 2 (two) times daily with a meal. 10/09/20  Yes Bridget Hartshorn L, PA-C    Allergies Ativan [lorazepam]  History reviewed. No pertinent family history.  Social History Social History   Tobacco Use   Smoking status: Every Day    Pack years: 0.00    Types: Cigarettes   Smokeless tobacco: Never  Vaping Use   Vaping Use: Never used  Substance Use Topics   Alcohol use: Never   Drug use: Never    Review of Systems Constitutional: No fever/chills Eyes: No visual  changes. Cardiovascular: Denies chest pain. Respiratory: Denies shortness of breath. Gastrointestinal: No abdominal pain.  No nausea, no vomiting.  No diarrhea.   Genitourinary: Negative for dysuria. Musculoskeletal: Complain of low back pain. Skin: Negative for rash. Neurological: Negative for headaches, focal weakness or numbness. ____________________________________________   PHYSICAL EXAM:  VITAL SIGNS: ED Triage Vitals  Enc Vitals Group     BP 10/09/20 1041 (!) 111/94     Pulse Rate 10/09/20 1041 (!) 108     Resp 10/09/20 1041 18     Temp 10/09/20 1041 98.7 F (37.1 C)     Temp Source 10/09/20 1041 Oral     SpO2 10/09/20 1041 96 %     Weight 10/09/20 1042 190 lb (86.2 kg)     Height 10/09/20 1042 6\' 2"  (1.88 m)     Head Circumference --      Peak Flow --      Pain Score 10/09/20 1042 10     Pain Loc --      Pain Edu? --      Excl. in GC? --     Constitutional: Alert and oriented. Well appearing and in no acute distress. Eyes: Conjunctivae are normal.  Head: Atraumatic. Neck: No stridor.   Cardiovascular: Normal rate, regular rhythm. Grossly normal heart sounds.  Good peripheral circulation. Respiratory: Normal respiratory effort.  No retractions. Lungs CTAB. Gastrointestinal: Soft and nontender. No distention.  No CVA tenderness. Musculoskeletal: On examination of the back there is no gross deformity.  There is generalized tenderness on palpation of the lower lumbar and paravertebral muscles bilaterally.  Patient is able to move and ambulate without any assistance.  No skin discoloration or edema is present.  Muscle strength bilaterally. Neurologic:  Normal speech and language. No gross focal neurologic deficits are appreciated. No gait instability. Skin:  Skin is warm, dry and intact.  No edema no skin discoloration present. Psychiatric: Mood and affect are normal. Speech and behavior are normal.  ____________________________________________   LABS (all labs  ordered are listed, but only abnormal results are displayed)  Labs Reviewed - No data to display ____________________________________________  ___________________________________________  RADIOLOGY Beaulah Corin, personally viewed and evaluated these images (plain radiographs) as part of my medical decision making, as well as reviewing the written report by the radiologist.  Lumbar and thoracic spine x-rays from 09/19/2020 were reviewed. ____________________________________________   PROCEDURES  Procedure(s) performed (including Critical Care):  Procedures   ____________________________________________   INITIAL IMPRESSION / ASSESSMENT AND PLAN / ED COURSE  As part of my medical decision making, I reviewed the following data within the electronic MEDICAL RECORD NUMBER Notes from prior ED visits and [place] Controlled Substance Database  45 year old male presents to the ED with complaint of chronic low back pain without improvement with Flexeril and gabapentin.  Patient states that the gabapentin makes him sleepy and that the Flexeril is not giving him any relief.  He also has been seen in the emergency department prior to that at which time he was started on Lidoderm patch which he states he still has.  Patient currently does not have a primary care provider and has not followed up with Dr. Myer Haff for his back pain.  Patient was given Toradol 30 mg IM.  X-rays from his visit on 09/19/2020 were reviewed.  Patient was discharged with prescription for naproxen 500 mg twice daily with food and baclofen 5 mg twice daily.  Patient is still encouraged to follow-up with Dr. Marcell Barlow.  Ice or heat to his back.  He is to discontinue taking the Flexeril and gabapentin.  Return to the emergency department if any severe worsening of his symptoms such as incontinence of bowel or bladder or saddle anesthesias. ____________________________________________   FINAL CLINICAL IMPRESSION(S) / ED  DIAGNOSES  Final diagnoses:  Chronic bilateral low back pain without sciatica     ED Discharge Orders          Ordered    lidocaine (LIDODERM) 5 %  Every 24 hours,   Status:  Discontinued        10/09/20 1239    naproxen (NAPROSYN) 500 MG tablet  2 times daily with meals        10/09/20 1323    Baclofen 5 MG TABS  2 times daily        10/09/20 1323             Note:  This document was prepared using Dragon voice recognition software and may include unintentional dictation errors.    Tommi Rumps, PA-C 10/09/20 1544    Delton Prairie, MD 10/10/20 [phone]

## 2020-10-09 NOTE — Discharge Instructions (Addendum)
You will still need to follow-up with her primary care provider.  Continue calling offices until you have found a primary care provider.  Dr. Joice Lofts is the orthopedist on-call and you may also call make a follow-up appointment with him only for your back issues.  A prescription for naproxen 500 mg twice daily and baclofen 5 mg once daily was sent to the pharmacy.  Discontinue taking the Flexeril and gabapentin as you states they are causing drowsiness and or not working.  Chronic back pain is not treated with narcotics.  You may use ice or heat to your back as needed for discomfort.

## 2020-10-09 NOTE — ED Triage Notes (Signed)
Pt via POV from home. Pt states that he had a back injury ion 2018, pt has a hx of spinal injuries. Pt c/o increased back pain today. Was seen recently and d/c but states that he does like the medication prescribed because it makes him sleepy. Pt is A&Ox4 and NAD.

## 2020-10-09 NOTE — ED Notes (Addendum)
See triage note  States he has had chronic back pain s/p fall in 2018  States pain is on left and moves into left leg    Pain has increased over the past 3 days

## 2020-11-19 ENCOUNTER — Emergency Department
Admission: EM | Admit: 2020-11-19 | Discharge: 2020-11-19 | Disposition: A | Discharge status: Home / Self Care | Payer: Medicare HMO | Attending: Emergency Medicine | Admitting: Emergency Medicine

## 2020-11-19 ENCOUNTER — Other Ambulatory Visit: Payer: Self-pay

## 2020-11-19 DIAGNOSIS — G8929 Other chronic pain: Secondary | ICD-10-CM | POA: Diagnosis not present

## 2020-11-19 DIAGNOSIS — F17210 Nicotine dependence, cigarettes, uncomplicated: Secondary | ICD-10-CM | POA: Diagnosis not present

## 2020-11-19 DIAGNOSIS — M545 Low back pain, unspecified: Secondary | ICD-10-CM | POA: Insufficient documentation

## 2020-11-19 MED ORDER — KETOROLAC TROMETHAMINE 30 MG/ML IJ SOLN
30.0000 mg | Freq: Once | INTRAMUSCULAR | Status: AC
  Administered 2020-11-19: 30 mg via INTRAMUSCULAR
  Filled 2020-11-19: qty 1

## 2020-11-19 MED ORDER — PREDNISONE 10 MG (21) PO TBPK: ORAL_TABLET | Freq: Every day | ORAL | 0 refills | Status: AC

## 2020-11-19 NOTE — ED Provider Notes (Signed)
[facility]-EMERGENCY DEPARTMENT  ____________________________________________  Time seen: Approximately 8:45 PM  I have reviewed the triage vital signs and the nursing notes.   HISTORY  Chief Complaint Back Pain   Historian Patient    HPI Patrick [name] is a 45 y.o. male presents to the emergency department with chronic low back pain.  Patient has received prescriptions for acetaminophen with codeine and Percocet on 726 and 729 from her prescriber in [place].  Patient denies bowel or bladder incontinence or saddle anesthesia.  No fever or chills.  Patient reports he has not been able to take the Tylenol with codeine as it is too sedating for him.  He is also tried muscle relaxers and gabapentin at home as well as lidocaine patches.  Patient would like refill of Percocet.   Past Medical History:  Diagnosis Date   Herniated cervical disc      Immunizations up to date:  Yes.     Past Medical History:  Diagnosis Date   Herniated cervical disc     There are no problems to display for this patient.   No past surgical history on file.  Prior to Admission medications   Medication Sig Start Date End Date Taking? Authorizing Provider  predniSONE (STERAPRED UNI-PAK 21 TAB) 10 MG (21) TBPK tablet Take by mouth daily for 6 days. 6,5,4,3,2,1 11/19/20 11/25/20 Yes Pia Mau M, PA-C  Baclofen 5 MG TABS Take 1 tablet by mouth 2 (two) times daily. 10/09/20   Tommi Rumps, PA-C  naproxen (NAPROSYN) 500 MG tablet Take 1 tablet (500 mg total) by mouth 2 (two) times daily with a meal. 10/09/20   Tommi Rumps, PA-C    Allergies Ativan [lorazepam]  No family history on file.  Social History Social History   Tobacco Use   Smoking status: Every Day    Types: Cigarettes   Smokeless tobacco: Never  Vaping Use   Vaping Use: Never used  Substance Use Topics   Alcohol use: Never   Drug use: Never     Review of Systems  Constitutional: No fever/chills Eyes:  No discharge ENT:  No upper respiratory complaints. Respiratory: no cough. No SOB/ use of accessory muscles to breath Gastrointestinal:   No nausea, no vomiting.  No diarrhea.  No constipation. Musculoskeletal: Patient has low back pain.  Skin: Negative for rash, abrasions, lacerations, ecchymosis.    ____________________________________________   PHYSICAL EXAM:  VITAL SIGNS: ED Triage Vitals  Enc Vitals Group     BP 11/19/20 1903 135/75     Pulse Rate 11/19/20 1903 92     Resp 11/19/20 1903 18     Temp 11/19/20 1903 99.1 F (37.3 C)     Temp Source 11/19/20 1903 Oral     SpO2 11/19/20 1903 98 %     Weight 11/19/20 1903 185 lb (83.9 kg)     Height 11/19/20 1903 6\' 2"  (1.88 m)     Head Circumference --      Peak Flow --      Pain Score 11/19/20 1908 8     Pain Loc --      Pain Edu? --      Excl. in GC? --      Constitutional: Alert and oriented. Well appearing and in no acute distress. Eyes: Conjunctivae are normal. PERRL. EOMI. Head: Atraumatic. ENT: Cardiovascular: Normal rate, regular rhythm. Normal S1 and S2.  Good peripheral circulation. Respiratory: Normal respiratory effort without tachypnea or retractions. Lungs CTAB. Good air entry to the  bases with no decreased or absent breath sounds Gastrointestinal: Bowel sounds x 4 quadrants. Soft and nontender to palpation. No guarding or rigidity. No distention. Musculoskeletal: Full range of motion to all extremities. No obvious deformities noted Neurologic:  Normal for age. No gross focal neurologic deficits are appreciated.  Skin:  Skin is warm, dry and intact. No rash noted. Psychiatric: Mood and affect are normal for age. Speech and behavior are normal.   ____________________________________________   LABS (all labs ordered are listed, but only abnormal results are displayed)  Labs Reviewed - No data to  display ____________________________________________  EKG   ____________________________________________  RADIOLOGY   No results found.  ____________________________________________    PROCEDURES  Procedure(s) performed:     Procedures     Medications  ketorolac (TORADOL) 30 MG/ML injection 30 mg (has no administration in time range)     ____________________________________________   INITIAL IMPRESSION / ASSESSMENT AND PLAN / ED COURSE  Pertinent labs & imaging results that were available during my care of the patient were reviewed by me and considered in my medical decision making (see chart for details).      Assessment and plan Low back pain 45 year old male presents to the emergency department with low back pain that is occurred chronically for the past several years.  Vital signs are reassuring at triage.  On physical exam, patient had symmetric strength and was able to ambulate easily through emergency department.  I reviewed patient's history and the drug database and noted two recent prescriptions for opiate containing medications.  Explained to patient that I cannot refill his Percocet at this time.  Patient states that he did not fill his prescription for Percocet and is uncertain why it shows that he had a prescription filled in late July.  Patient was given an injection of Toradol in the emergency department and was discharged with tapered prednisone. He was also given a referral to neurosurgery.  All patient questions were answered.     ____________________________________________  FINAL CLINICAL IMPRESSION(S) / ED DIAGNOSES  Final diagnoses:  Chronic bilateral low back pain without sciatica      NEW MEDICATIONS STARTED DURING THIS VISIT:  ED Discharge Orders          Ordered    predniSONE (STERAPRED UNI-PAK 21 TAB) 10 MG (21) TBPK tablet  Daily        11/19/20 2041                This chart was dictated using voice  recognition software/Dragon. Despite best efforts to proofread, errors can occur which can change the meaning. Any change was purely unintentional.     Orvil Feil, PA-C 11/19/20 2050    Sharman Cheek, MD 11/19/20 [phone]

## 2020-11-19 NOTE — ED Triage Notes (Signed)
Pt states is having low back pain for years. Tp states began after an accident. Pt states he can no longer take the pain. Pt states has been trying medicaition without relief. No loss of bowel or bladder per pt. Pt appears in no acute distress.

## 2020-11-19 NOTE — Discharge Instructions (Addendum)
Take tapered steroid as directed.  

## 2020-11-27 ENCOUNTER — Emergency Department
Admission: EM | Admit: 2020-11-27 | Discharge: 2020-11-27 | Discharge status: Against Medical Advice | Payer: Medicare HMO

## 2021-05-16 ENCOUNTER — Emergency Department
Admission: EM | Admit: 2021-05-16 | Discharge: 2021-05-16 | Disposition: A | Discharge status: Home / Self Care | Payer: Medicare HMO | Attending: Emergency Medicine | Admitting: Emergency Medicine

## 2021-05-16 ENCOUNTER — Encounter: Payer: Self-pay | Admitting: Emergency Medicine

## 2021-05-16 ENCOUNTER — Other Ambulatory Visit: Payer: Self-pay

## 2021-05-16 DIAGNOSIS — M545 Low back pain, unspecified: Secondary | ICD-10-CM | POA: Diagnosis present

## 2021-05-16 DIAGNOSIS — M5416 Radiculopathy, lumbar region: Secondary | ICD-10-CM | POA: Diagnosis not present

## 2021-05-16 DIAGNOSIS — M5442 Lumbago with sciatica, left side: Secondary | ICD-10-CM | POA: Diagnosis not present

## 2021-05-16 MED ORDER — PREDNISONE 10 MG (21) PO TBPK: ORAL_TABLET | ORAL | 0 refills | Status: DC

## 2021-05-16 MED ORDER — PREDNISONE 20 MG PO TABS
60.0000 mg | ORAL_TABLET | Freq: Once | ORAL | Status: AC
  Administered 2021-05-16: 60 mg via ORAL
  Filled 2021-05-16: qty 3

## 2021-05-16 MED ORDER — KETOROLAC TROMETHAMINE 30 MG/ML IJ SOLN
30.0000 mg | Freq: Once | INTRAMUSCULAR | Status: AC
  Administered 2021-05-16: 30 mg via INTRAMUSCULAR
  Filled 2021-05-16: qty 1

## 2021-05-16 NOTE — ED Triage Notes (Signed)
Pt comes into the ED via POV c/o back pain.  Pt has chronic back pain from a 53ft fall back in 2018.  Pt states he just moved here from [place] and got here yesterday and he is having problems with it again.  Pt states he has not been able to get established with any new MD's yet.  Pt ambulatory to triage and in NAD at this time.

## 2021-05-16 NOTE — ED Notes (Signed)
Pt provided discharge instructions and prescription information. Pt was given the opportunity to ask questions and questions were answered. Discharge signature not obtained in the setting of the COVID-19 pandemic in order to reduce high touch surfaces.  ° °

## 2021-05-16 NOTE — ED Provider Notes (Signed)
[facility] Provider Note  Patient Contact: 5:59 PM (approximate)   History   Back Pain   HPI  Patrick [name] is a 46 y.o. male presents to the emergency department with acute on chronic low back pain.  Patient has been seen in this emergency department multiple times in the past for similar symptoms.  Patient states that his current pain is consistent with chronic back pain in the past.  He describes it as left sided and radiating into the buttocks.  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  He states that he has not had an opportunity to follow-up with the neurosurgeon as directed by multiple providers in the past.  He states that he does take oxycodone for his back pain and left his medication in [place].  He states that he is open to following up with neurosurgery and would like help for his current pain.      Physical Exam   Triage Vital Signs: ED Triage Vitals  Enc Vitals Group     BP 05/16/21 1459 (!) 117/97     Pulse Rate 05/16/21 1459 (!) 112     Resp 05/16/21 1459 18     Temp 05/16/21 1459 98.9 F (37.2 C)     Temp Source 05/16/21 1459 Oral     SpO2 05/16/21 1459 98 %     Weight 05/16/21 1500 184 lb 15.5 oz (83.9 kg)     Height 05/16/21 1500 6\' 2"  (1.88 m)     Head Circumference --      Peak Flow --      Pain Score 05/16/21 1500 9     Pain Loc --      Pain Edu? --      Excl. in GC? --     Most recent vital signs: Vitals:   05/16/21 1459  BP: (!) 117/97  Pulse: (!) 112  Resp: 18  Temp: 98.9 F (37.2 C)  SpO2: 98%     General: Alert and in no acute distress. Eyes:  PERRL. EOMI. Head: No acute traumatic findings ENT:      Ears:       Nose: No congestion/rhinnorhea.      Mouth/Throat: Mucous membranes are moist. Neck: No stridor. No cervical spine tenderness to palpation. Cardiovascular:  Good peripheral perfusion Respiratory: Normal respiratory effort  without tachypnea or retractions. Lungs CTAB. Good air entry to the bases with no decreased or absent breath sounds. Gastrointestinal: Bowel sounds 4 quadrants. Soft and nontender to palpation. No guarding or rigidity. No palpable masses. No distention. No CVA tenderness. Musculoskeletal: Full range of motion to all extremities.  Patient has paraspinal muscle tenderness on the left with a positive straight leg raise test. Neurologic:  No gross focal neurologic deficits are appreciated.  Skin:   No rash noted Other:   ED Results / Procedures / Treatments   Labs (all labs ordered are listed, but only abnormal results are displayed) Labs Reviewed - No data to display         PROCEDURES:  Critical Care performed: No  Procedures   MEDICATIONS ORDERED IN ED: Medications  predniSONE (DELTASONE) tablet 60 mg (60 mg Oral Given 05/16/21 1735)  ketorolac (TORADOL) 30 MG/ML injection 30 mg (30 mg Intramuscular Given 05/16/21 1737)     IMPRESSION / MDM / ASSESSMENT AND PLAN / ED COURSE  I reviewed the triage vital signs and  the nursing notes.                              Assessment and plan: Lumbar radiculopathy:  Differential diagnosis includes, but is not limited to, low back pain, lumbar radiculopathy, herniated disc...  46 year old male presents to the emergency department with acute on chronic low back pain.  He was mildly tachycardic at triage but vital signs otherwise reassuring.  He was alert, active and nontoxic-appearing with no neurodeficits noted and was able to ambulate easily through emergency department.  Offered Toradol and prednisone explained to patient that we cannot refill his Percocet.  I did refer patient to neurosurgery, Dr. Marcell Barlow.      FINAL CLINICAL IMPRESSION(S) / ED DIAGNOSES   Final diagnoses:  Acute bilateral low back pain with left-sided sciatica     Rx / DC Orders   ED Discharge Orders          Ordered    predniSONE (STERAPRED UNI-PAK  21 TAB) 10 MG (21) TBPK tablet        05/16/21 1714             Note:  This document was prepared using Dragon voice recognition software and may include unintentional dictation errors.   Pia Mau [place], PA-C 05/16/21 1809    Georga Hacking, MD 05/16/21 2035

## 2021-05-16 NOTE — Discharge Instructions (Signed)
Take tapered steroid as directed. °Please make follow-up appointment with Dr. Yarborough °

## 2021-07-18 ENCOUNTER — Ambulatory Visit (INDEPENDENT_AMBULATORY_CARE_PROVIDER_SITE_OTHER): Payer: Medicare HMO | Admitting: Family

## 2021-07-18 ENCOUNTER — Encounter: Payer: Self-pay | Admitting: Family

## 2021-07-18 VITALS — BP 105/77 | HR 84 | Temp 98.4°F | Ht 74.0 in | Wt 186.1 lb

## 2021-07-18 DIAGNOSIS — M5416 Radiculopathy, lumbar region: Secondary | ICD-10-CM | POA: Diagnosis not present

## 2021-07-18 MED ORDER — OXYCODONE-ACETAMINOPHEN 5-325 MG PO TABS: 1.0000 | ORAL_TABLET | Freq: Three times a day (TID) | ORAL | 0 refills | Status: AC | PRN

## 2021-07-18 NOTE — Assessment & Plan Note (Signed)
pt moved here from [place] - originally [place], back pain since and has been taking OXY 10mg , qid for 7 years. Since in town has visited ER several times due to pain. [place] PCP sent RX for OXY in Feb. PDMP checked and verified, controlled substance contract signed, referral sent to pain clinic, advised pt to also call on his own. Pt understands he will require weekly visits for refills until can be established with pain clinic. ?

## 2021-07-18 NOTE — Progress Notes (Signed)
? ?New Patient Office Visit ? ?Subjective:  ?Patient ID: Patrick [name], male    DOB: 08-14-1975  Age: 46 y.o. MRN: [id number] ? ?CC:  ?Chief Complaint  ?Patient presents with  ? Establish Care  ? Pain  ? ? ?HPI ?Patrick [name] presents for establishing care. ? ?HPI: ?Pain ?He reports chronic lumbar pain. There was an injury that may have caused the pain, pt fell [place] 7 yrs ago. The pain started 7 years ago and is staying constant. The pain does radiate both legs. The pain is described as aching, burning, sharp, soreness, stabbing, and tingling, is 9/10 in intensity, occurring constantly. Symptoms are worse in the: all the time.  ?Aggravating factors: sitting, standing, and walking He has tried application of heat, application of ice, acetaminophen, NSAIDs, oral steroids, prescription pain relievers, topical anesthetics, chiropractic treatments, physical thearpy, and message therapy with little relief.  ? ?Past Medical History:  ?Diagnosis Date  ? Anxiety   ? Depression   ? Herniated cervical disc   ? Neuromuscular disorder (HCC)   ? ? ?History reviewed. No pertinent surgical history. ? ?History reviewed. No pertinent family history. ? ?Social History  ? ?Socioeconomic History  ? Marital status: Divorced  ?  Spouse name: Not on file  ? Number of children: 5  ? Years of education: Not on file  ? Highest education level: Not on file  ?Occupational History  ? Not on file  ?Tobacco Use  ? Smoking status: Every Day  ?  Packs/day: 0.25  ?  Years: 3.00  ?  Pack years: 0.75  ?  Types: Cigarettes  ? Smokeless tobacco: Never  ?Vaping Use  ? Vaping Use: Never used  ?Substance and Sexual Activity  ? Alcohol use: Never  ? Drug use: Never  ? Sexual activity: Not on file  ?Other Topics Concern  ? Not on file  ?Social History Narrative  ? Not on file  ? ?Social Determinants of Health  ? ?Financial Resource Strain: Not on file  ?Food Insecurity: Not on file  ?Transportation Needs: Not on file  ?Physical  Activity: Not on file  ?Stress: Not on file  ?Social Connections: Not on file  ?Intimate Partner Violence: Not on file  ? ? ?Objective:  ? ?Today's Vitals: BP 105/77 (BP Location: Left Arm, Patient Position: Sitting, Cuff Size: Large)   Pulse 84   Temp 98.4 ?F (36.9 ?C) (Temporal)   Ht 6\' 2"  (1.88 m)   Wt 186 lb 2 oz (84.4 kg)   SpO2 95%   BMI 23.90 kg/m?  ? ?Physical Exam ?Vitals and nursing note reviewed.  ?Constitutional:   ?   General: He is not in acute distress. ?   Appearance: Normal appearance.  ?HENT:  ?   Head: Normocephalic.  ?Cardiovascular:  ?   Rate and Rhythm: Normal rate and regular rhythm.  ?Pulmonary:  ?   Effort: Pulmonary effort is normal.  ?   Breath sounds: Normal breath sounds.  ?Musculoskeletal:  ?   Cervical back: Normal range of motion.  ?   Lumbar back: Decreased range of motion.  ?Skin: ?   General: Skin is warm and dry.  ?Neurological:  ?   Mental Status: He is alert and oriented to person, place, and time.  ?Psychiatric:     ?   Mood and Affect: Mood normal.  ? ? ?Assessment & Plan:  ? ?Problem List Items Addressed This Visit   ? ?  ? Nervous and  Auditory  ? Chronic lumbar radiculopathy - Primary  ?  pt moved here from [place] - originally injured [place] airport on [place], back pain since and has been taking OXY 10mg , qid for 7 years. Since in town has visited ER several times due to pain. [place] PCP sent RX for OXY in Feb. PDMP checked and verified, controlled substance contract signed, referral sent to pain clinic, advised pt to also call on his own. Pt understands he will require weekly visits for refills until can be established with pain clinic. ?  ?  ? Relevant Medications  ? oxyCODONE-acetaminophen (PERCOCET/ROXICET) 5-325 MG tablet  ? Other Relevant Orders  ? Ambulatory referral to Pain Clinic  ? ? ?Outpatient Encounter Medications as of 07/18/2021  ?Medication Sig  ? oxyCODONE-acetaminophen (PERCOCET/ROXICET) 5-325 MG tablet Take 1 tablet by mouth every 8  (eight) hours as needed for up to 7 days for severe pain.  ? [DISCONTINUED] ALPRAZolam (XANAX) 0.5 MG tablet Take 0.5 mg by mouth at bedtime as needed for anxiety.  ? [DISCONTINUED] Baclofen 5 MG TABS Take 1 tablet by mouth 2 (two) times daily.  ? [DISCONTINUED] naproxen (NAPROSYN) 500 MG tablet Take 1 tablet (500 mg total) by mouth 2 (two) times daily with a meal.  ? [DISCONTINUED] oxyCODONE-acetaminophen (PERCOCET) 10-325 MG tablet Take 1 tablet by mouth every 4 (four) hours as needed for pain.  ? [DISCONTINUED] predniSONE (STERAPRED UNI-PAK 21 TAB) 10 MG (21) TBPK tablet 6,6,5,5,4,4,3,3,2,2,1,1  ? ?No facility-administered encounter medications on file as of 07/18/2021.  ? ? ?Follow-up: Return for any future concerns.  ? ?09/17/2021, NP ? ?

## 2021-07-18 NOTE — Patient Instructions (Signed)
Welcome to [facility]! It was a pleasure meeting you today. ? ?I have sent a 7 day supply for your Oxycodone pills, make this last as long as you can, you will need another office visit if a refill is needed. ?I have also sent a referral to our pain clinic - I would continue to call around to see if you can get an appointment or on a wait list on your own. If they need a referral, let me know and I will send. ? ? ? ?PLEASE NOTE: ? ?If you had any LAB tests please let us know if you have not heard back within a few days. You may see your results on MyChart before we have a chance to review them but we will give you a call once they are reviewed by [place]. If we ordered any REFERRALS today, please let us know if you have not heard from their office within the next week.  ?Let us know through MyChart if you are needing REFILLS, or have your pharmacy send [place] the request. You can also use MyChart to communicate with me or any office staff. ? ?Please try these tips to maintain a healthy lifestyle: ? ?Eat most of your calories during the day when you are active. Eliminate processed foods including packaged sweets (pies, cakes, cookies), reduce intake of potatoes, white bread, white pasta, and white rice. Look for whole grain options, oat flour or almond flour. ? ?Each meal should contain half fruits/vegetables, one quarter protein, and one quarter carbs (no bigger than a computer mouse). ? ?Cut down on sweet beverages. This includes juice, soda, and sweet tea. Also watch fruit intake, though this is a healthier sweet option, it still contains natural sugar! Limit to 3 servings daily. ? ?Drink at least 1 glass of water with each meal and aim for at least 8 glasses per day ? ?Exercise at least 150 minutes every week.  ? ?

## 2021-07-19 ENCOUNTER — Encounter: Payer: Self-pay | Admitting: Emergency Medicine

## 2021-07-19 ENCOUNTER — Emergency Department
Admission: EM | Admit: 2021-07-19 | Discharge: 2021-07-19 | Disposition: A | Discharge status: Home / Self Care | Payer: Medicare HMO | Attending: Emergency Medicine | Admitting: Emergency Medicine

## 2021-07-19 ENCOUNTER — Other Ambulatory Visit: Payer: Self-pay

## 2021-07-19 DIAGNOSIS — F410 Panic disorder [episodic paroxysmal anxiety] without agoraphobia: Secondary | ICD-10-CM | POA: Diagnosis not present

## 2021-07-19 DIAGNOSIS — F419 Anxiety disorder, unspecified: Secondary | ICD-10-CM

## 2021-07-19 MED ORDER — ALPRAZOLAM 0.25 MG PO TABS: 0.2500 mg | ORAL_TABLET | Freq: Three times a day (TID) | ORAL | 0 refills | Status: DC | PRN

## 2021-07-19 MED ORDER — LORAZEPAM 2 MG/ML IJ SOLN
2.0000 mg | Freq: Once | INTRAMUSCULAR | Status: AC
  Administered 2021-07-19: 2 mg via INTRAMUSCULAR
  Filled 2021-07-19: qty 1

## 2021-07-19 NOTE — ED Triage Notes (Signed)
Pt comes into the ED via POV c/o anxiety attack.  Pt states he normally takes alprazolam, but ran out of it 2 weeks ago.  Pt presents anxious with his leg twitching in triage. Pt states he took benadryl at home hoping it would help tire him out to calm him down.  PT denies any SI or HI.  ?

## 2021-07-19 NOTE — ED Provider Notes (Signed)
? ?[facility] ?Provider Note ? ? ? Event Date/Time  ? First MD Initiated Contact with Patient 07/19/21 1032   ?  (approximate) ? ? ?History  ? ?Anxiety ? ? ?HPI ? ?Patrick [name] is a 46 y.o. male who presents to the ED for evaluation of Anxiety ?  ?Patient presents to the ED for evaluation of anxiety and a panic attack.  He reports a history of anxiety and intermittently has severe panic attacks.  Reports being given alprazolam months ago and recently ran out of this medication.  Reports he last took it over a week ago, and when he was taking it was not a daily thing. ? ?He reports awakening this morning with severe anxiety, racing thoughts and difficulty functioning at his baseline.  Denies any suicidal intent, overdose or recreational drug use. ? ? ?Physical Exam  ? ?Triage Vital Signs: ?ED Triage Vitals  ?Enc Vitals Group  ?   BP 07/19/21 1025 107/78  ?   Pulse Rate 07/19/21 1025 91  ?   Resp 07/19/21 1025 20  ?   Temp 07/19/21 1025 98.5 ?F (36.9 ?C)  ?   Temp Source 07/19/21 1025 Oral  ?   SpO2 07/19/21 1025 96 %  ?   Weight 07/19/21 1029 186 lb 2 oz (84.4 kg)  ?   Height 07/19/21 1029 6\' 2"  (1.88 m)  ?   Head Circumference --   ?   Peak Flow --   ?   Pain Score 07/19/21 1029 0  ?   Pain Loc --   ?   Pain Edu? --   ?   Excl. in GC? --   ? ? ?Most recent vital signs: ?Vitals:  ? 07/19/21 1025  ?BP: 107/78  ?Pulse: 91  ?Resp: 20  ?Temp: 98.5 ?F (36.9 ?C)  ?SpO2: 96%  ? ? ?General: Awake.  Pacing the halls and anxious.  Wringing his hands. ?CV:  Good peripheral perfusion.  ?Resp:  Normal effort.  ?Abd:  No distention.  ?MSK:  No deformity noted.  ?Neuro:  No focal deficits appreciated. Cranial nerves II through XII intact ?5/5 strength and sensation in all 4 extremities ? ?Other:  Linear thoughts.  Consolable and redirectable. ? ? ?ED Results / Procedures / Treatments  ? ?Labs ?(all labs ordered are listed, but only abnormal results are displayed) ?Labs Reviewed - No data to  display ? ?EKG ? ? ?RADIOLOGY ? ? ?Official radiology report(s): ?No results found. ? ?PROCEDURES and INTERVENTIONS: ? ?Procedures ? ?Medications  ?LORazepam (ATIVAN) injection 2 mg (2 mg Intramuscular Given 07/19/21 1124)  ? ? ? ?IMPRESSION / MDM / ASSESSMENT AND PLAN / ED COURSE  ?I reviewed the triage vital signs and the nursing notes. ? ?46 year old male presents to the ED with severe anxiety and panic attack, resolving after a single episode of intramuscular benzodiazepines, and suitable for outpatient management with [facility] follow-up.  He is quite anxious on arrival, pacing the halls and seems to be having a panic attack.  Single dose of intramuscular calming agent resolves his symptoms and he looks well.  He is ambulatory with normal gait, clinically sober and tolerating this dose well.  He is thankful.  We discussed discharge with a couple tablets of benzodiazepines, but emphasized supportive measures for anxiety.  We discussed following up with [facility] and return precautions for the ED.  He is suitable for outpatient management.  No indications for emergent psychiatric referral, IVC.  No signs of coingestions or  toxidromes. ? ?Clinical Course as of 07/19/21 1158  ?Thu Jul 19, 2021  ?1153 Reassessed.  Feeling much better.  We discussed safe usage of benzodiazepines, caution with coingestions of alcohol.  Discussed following up with [facility] and discussed return precautions for the ED. [DS]  ?  ?Clinical Course User Index ?[DS] Delton Prairie, MD  ? ? ? ?FINAL CLINICAL IMPRESSION(S) / ED DIAGNOSES  ? ?Final diagnoses:  ?Panic attack  ?Anxiety  ? ? ? ?Rx / DC Orders  ? ?ED Discharge Orders   ? ?      Ordered  ?  ALPRAZolam (XANAX) 0.25 MG tablet  3 times daily PRN       ? 07/19/21 1154  ? ?  ?  ? ?  ? ? ? ?Note:  This document was prepared using Dragon voice recognition software and may include unintentional dictation errors. ?  ?Delton Prairie, MD ?07/19/21 1200 ? ?

## 2021-07-25 ENCOUNTER — Telehealth: Payer: Self-pay | Admitting: Family

## 2021-07-25 NOTE — Telephone Encounter (Signed)
.. ?  Encourage patient to contact the pharmacy for refills or they can request refills through [facility] ? ?LAST APPOINTMENT DATE:  07/18/21 ? ?NEXT APPOINTMENT DATE: N/A ? ?MEDICATION:oxyCODONE-acetaminophen (PERCOCET/ROXICET) 5-325 MG tablet ? ?Is the patient out of medication?  ? ?PHARMACY: ?[id number] - [place],  - [address] Phone:  [phone]  ?Fax:  [phone]  ?  ? ? ?Let patient know to contact pharmacy at the end of the day to make sure medication is ready. ? ?Please notify patient to allow 48-72 hours to process  ?

## 2021-07-26 NOTE — Telephone Encounter (Signed)
He has to come in for more pills, he has an appt on 4/24, will fill then.

## 2021-07-30 ENCOUNTER — Ambulatory Visit: Payer: Medicare HMO | Admitting: Nurse Practitioner

## 2021-07-31 ENCOUNTER — Telehealth: Payer: Medicare HMO | Admitting: Family

## 2021-07-31 DIAGNOSIS — M5416 Radiculopathy, lumbar region: Secondary | ICD-10-CM

## 2021-08-06 ENCOUNTER — Telehealth (INDEPENDENT_AMBULATORY_CARE_PROVIDER_SITE_OTHER): Payer: Medicare HMO | Admitting: Family

## 2021-08-06 ENCOUNTER — Encounter: Payer: Self-pay | Admitting: Family

## 2021-08-06 VITALS — Ht 74.0 in | Wt 186.0 lb

## 2021-08-06 DIAGNOSIS — F411 Generalized anxiety disorder: Secondary | ICD-10-CM | POA: Diagnosis not present

## 2021-08-06 DIAGNOSIS — F410 Panic disorder [episodic paroxysmal anxiety] without agoraphobia: Secondary | ICD-10-CM

## 2021-08-06 DIAGNOSIS — M5416 Radiculopathy, lumbar region: Secondary | ICD-10-CM | POA: Diagnosis not present

## 2021-08-06 MED ORDER — ESCITALOPRAM OXALATE 5 MG PO TABS: 5.0000 mg | ORAL_TABLET | ORAL | 0 refills | Status: DC

## 2021-08-06 MED ORDER — OXYCODONE-ACETAMINOPHEN 5-325 MG PO TABS: 1.0000 | ORAL_TABLET | Freq: Three times a day (TID) | ORAL | 0 refills | Status: DC

## 2021-08-06 NOTE — Progress Notes (Signed)
? ? ?MyChart Video Visit ? ? ? ?Virtual Visit via Video Note  ? ?This visit type was conducted due to national recommendations for restrictions regarding the COVID-19 Pandemic (e.g. social distancing) in an effort to limit this patient's exposure and mitigate transmission in our community. This patient is at least at moderate risk for complications without adequate follow up. This format is felt to be most appropriate for this patient at this time. Physical exam was limited by quality of the video and audio technology used for the visit. CMA was able to get the patient set up on a video visit. ? ?Patient location: Home. Patient and provider in visit ?Provider location: Office ? ?I discussed the limitations of evaluation and management by telemedicine and the availability of in person appointments. The patient expressed understanding and agreed to proceed. ? ?Visit Date: 08/06/2021 ? ?Today's healthcare provider: Dulce Sellar, NP  ? ? ? ?Subjective:  ? ? Patient ID: Patrick [name], male    DOB: 1975-08-12, 46 y.o.   MRN: [id number] ? ?Chief Complaint  ?Patient presents with  ? Back Pain  ?  Pt states back pain is horrible.   ? Anxiety  ? ?HPI: ?Anxiety/Depression: Patient complains of anxiety disorder.   ?He has the following symptoms: chest pain, difficulty concentrating, feelings of losing control, insomnia, irritable, palpitations, racing thoughts, shortness of breath, sweating.  ?Onset of symptoms was approximately  years ago, He denies current suicidal and homicidal ideation.  ?Possible organic causes contributing are: none.  ?Risk factors: negative life event history of family abuse, medical injuries, chronic pain.  ?Previous treatment includes Xanax. He complains of the following side effects from the treatment: none. ? ?  07/18/2021  ?  8:36 AM  ?Depression screen PHQ 2/9  ?Decreased Interest 0  ?Down, Depressed, Hopeless 0  ?PHQ - 2 Score 0  ? ?Pain ?He reports chronic lumbar pain. There was an injury  that may have caused the pain, [place] 7 [place]. The pain started 7 years ago and is staying constant. The pain does radiate both legs. The pain is described as aching, burning, sharp, soreness, stabbing, and tingling, is 9/10 in intensity, occurring constantly. Symptoms are worse in the: all the time.  ?Aggravating factors: sitting, standing, and walking He has tried application of heat, application of ice, acetaminophen, NSAIDs, oral steroids, prescription pain relievers, topical anesthetics, chiropractic treatments, physical thearpy, and message therapy with little relief.  ? ?Assessment & Plan:  ? ?Problem List Items Addressed This Visit   ? ?  ? Nervous and Auditory  ? Chronic lumbar radiculopathy - Primary  ?  pt has not heard from pain clinic yet, phone number provided for him to call, referral is still under review. Continue to offer other medications, pt refusing.  Refilling OXY x 1 week, PDMP reviewed. ? ?  ?  ? Relevant Medications  ? oxyCODONE-acetaminophen (PERCOCET/ROXICET) 5-325 MG tablet  ? escitalopram (LEXAPRO) 5 MG tablet  ?  ? Other  ? Generalized anxiety disorder with panic attacks  ?  Chronic - pt has taken Xanax in past. Advised I am not able to prescribe this along with his OXY, states this is only thing that works for him, advised on other safer meds to take, willing to try Lexapro, advised on use & SE, f/u in 1 month. ? ?  ?  ? Relevant Medications  ? escitalopram (LEXAPRO) 5 MG tablet  ? ? ?Past Medical History:  ?Diagnosis  Date  ? Depression   ? Herniated cervical disc   ? Neuromuscular disorder (HCC)   ? ? ?No past surgical history on file. ? ?Outpatient Medications Prior to Visit  ?Medication Sig Dispense Refill  ? ALPRAZolam (XANAX) 0.25 MG tablet Take 1 tablet (0.25 mg total) by mouth 3 (three) times daily as needed for anxiety. 5 tablet 0  ? oxyCODONE-acetaminophen (PERCOCET/ROXICET) 5-325 MG tablet Take 3 tablets by mouth in the morning, at noon, and  at bedtime.    ? ?No facility-administered medications prior to visit.  ? ? ?Allergies  ?Allergen Reactions  ? Ativan [Lorazepam] Anxiety  ? ? ?   ?Objective:  ?  ? ?Physical Exam ?Vitals and nursing note reviewed.  ?Constitutional:   ?   General: She is not in acute distress. ?   Appearance: Normal appearance.  ?HENT:  ?   Head: Normocephalic.  ?Pulmonary:  ?   Effort: No respiratory distress.  ?Musculoskeletal:  ?   Cervical back: Normal range of motion.  ?Skin: ?   General: Skin is dry.  ?   Coloration: Skin is not pale.  ?Neurological:  ?   Mental Status: She is alert and oriented to person, place, and time.  ?Psychiatric:     ?   Mood and Affect: Mood normal.  ? ?Ht 6\' 2"  (1.88 m)   Wt 186 lb (84.4 kg)   BMI 23.88 kg/m?  ? ?Wt Readings from Last 3 Encounters:  ?08/06/21 186 lb (84.4 kg)  ?07/19/21 186 lb 2 oz (84.4 kg)  ?07/18/21 186 lb 2 oz (84.4 kg)  ? ?   ? ?I discussed the assessment and treatment plan with the patient. The patient was provided an opportunity to ask questions and all were answered. The patient agreed with the plan and demonstrated an understanding of the instructions. ?  ?The patient was advised to call back or seek an in-person evaluation if the symptoms worsen or if the condition fails to improve as anticipated. ? ?I provided 22 minutes of face-to-face time during this encounter. ? ?09/17/21, NP ?[facility] ?[phone] (phone) ?[phone] (fax) ? ?[facility]  ?

## 2021-08-06 NOTE — Assessment & Plan Note (Addendum)
pt has not heard from pain clinic yet, phone number provided for him to call, referral is still under review. Continue to offer other medications, pt refusing.  Refilling OXY x 1 week, PDMP reviewed. ?

## 2021-08-06 NOTE — Assessment & Plan Note (Signed)
Chronic - pt has taken Xanax in past. Advised I am not able to prescribe this along with his OXY, states this is only thing that works for him, advised on other safer meds to take, willing to try Lexapro, advised on use & SE, f/u in 1 month. ?

## 2021-08-11 ENCOUNTER — Other Ambulatory Visit: Payer: Self-pay

## 2021-08-11 ENCOUNTER — Emergency Department
Admission: EM | Admit: 2021-08-11 | Discharge: 2021-08-11 | Disposition: A | Discharge status: Home / Self Care | Payer: Medicare HMO | Attending: Student in an Organized Health Care Education/Training Program | Admitting: Student in an Organized Health Care Education/Training Program

## 2021-08-11 ENCOUNTER — Encounter: Payer: Self-pay | Admitting: Emergency Medicine

## 2021-08-11 DIAGNOSIS — M5416 Radiculopathy, lumbar region: Secondary | ICD-10-CM | POA: Diagnosis not present

## 2021-08-11 DIAGNOSIS — M545 Low back pain, unspecified: Secondary | ICD-10-CM | POA: Diagnosis present

## 2021-08-11 LAB — CBC WITH DIFFERENTIAL/PLATELET
Abs Immature Granulocytes: 0.03 10*3/uL (ref 0.00–0.07)
Basophils Absolute: 0.1 10*3/uL (ref 0.0–0.1)
Basophils Relative: 1 %
Eosinophils Absolute: 0.1 10*3/uL (ref 0.0–0.5)
Eosinophils Relative: 1 %
HCT: 47.4 % (ref 39.0–52.0)
Hemoglobin: 15.5 g/dL (ref 13.0–17.0)
Immature Granulocytes: 0 %
Lymphocytes Relative: 20 %
Lymphs Abs: 1.8 10*3/uL (ref 0.7–4.0)
MCH: 29.8 pg (ref 26.0–34.0)
MCHC: 32.7 g/dL (ref 30.0–36.0)
MCV: 91.2 fL (ref 80.0–100.0)
Monocytes Absolute: 0.5 10*3/uL (ref 0.1–1.0)
Monocytes Relative: 5 %
Neutro Abs: 6.5 10*3/uL (ref 1.7–7.7)
Neutrophils Relative %: 73 %
Platelets: 273 10*3/uL (ref 150–400)
RBC: 5.2 MIL/uL (ref 4.22–5.81)
RDW: 13.7 % (ref 11.5–15.5)
WBC: 9 10*3/uL (ref 4.0–10.5)
nRBC: 0 % (ref 0.0–0.2)

## 2021-08-11 LAB — URINALYSIS, ROUTINE W REFLEX MICROSCOPIC
Bilirubin Urine: NEGATIVE
Glucose, UA: NEGATIVE mg/dL
Hgb urine dipstick: NEGATIVE
Ketones, ur: 80 mg/dL — AB
Leukocytes,Ua: NEGATIVE
Nitrite: NEGATIVE
Protein, ur: NEGATIVE mg/dL
Specific Gravity, Urine: 1.015 (ref 1.005–1.030)
pH: 6 (ref 5.0–8.0)

## 2021-08-11 LAB — COMPREHENSIVE METABOLIC PANEL
ALT: 14 U/L (ref 0–44)
AST: 18 U/L (ref 15–41)
Albumin: 4.3 g/dL (ref 3.5–5.0)
Alkaline Phosphatase: 64 U/L (ref 38–126)
Anion gap: 9 (ref 5–15)
BUN: 7 mg/dL (ref 6–20)
CO2: 25 mmol/L (ref 22–32)
Calcium: 9.3 mg/dL (ref 8.9–10.3)
Chloride: 107 mmol/L (ref 98–111)
Creatinine, Ser: 0.84 mg/dL (ref 0.61–1.24)
GFR, Estimated: >60 mL/min (ref >60)
Glucose, Bld: 105 mg/dL — ABNORMAL HIGH (ref 70–99)
Potassium: 4 mmol/L (ref 3.5–5.1)
Sodium: 141 mmol/L (ref 135–145)
Total Bilirubin: 1.3 mg/dL — ABNORMAL HIGH (ref 0.3–1.2)
Total Protein: 7.4 g/dL (ref 6.5–8.1)

## 2021-08-11 LAB — D-DIMER, QUANTITATIVE: D-Dimer, Quant: <0.27 ug/mL-FEU (ref 0.00–0.50)

## 2021-08-11 MED ORDER — MORPHINE SULFATE (PF) 2 MG/ML IV SOLN
2.0000 mg | Freq: Once | INTRAVENOUS | Status: AC
  Administered 2021-08-11: 2 mg via INTRAVENOUS
  Filled 2021-08-11: qty 1

## 2021-08-11 MED ORDER — LIDOCAINE 5 % EX PTCH
1.0000 | MEDICATED_PATCH | CUTANEOUS | Status: DC
  Administered 2021-08-11: 1 via TRANSDERMAL
  Filled 2021-08-11: qty 1

## 2021-08-11 MED ORDER — KETOROLAC TROMETHAMINE 15 MG/ML IJ SOLN
15.0000 mg | Freq: Once | INTRAMUSCULAR | Status: AC
  Administered 2021-08-11: 15 mg via INTRAVENOUS
  Filled 2021-08-11: qty 1

## 2021-08-11 MED ORDER — SODIUM CHLORIDE 0.9 % IV BOLUS
1000.0000 mL | Freq: Once | INTRAVENOUS | Status: AC
  Administered 2021-08-11: 1000 mL via INTRAVENOUS

## 2021-08-11 MED ORDER — NAPROXEN 500 MG PO TABS
500.0000 mg | ORAL_TABLET | Freq: Two times a day (BID) | ORAL | 2 refills | Status: DC
Start: 2021-08-11 — End: 2022-01-17

## 2021-08-11 MED ORDER — ONDANSETRON HCL 4 MG/2ML IJ SOLN
4.0000 mg | Freq: Once | INTRAMUSCULAR | Status: AC
Start: 2021-08-11 — End: 2021-08-11
  Administered 2021-08-11: 4 mg via INTRAVENOUS
  Filled 2021-08-11: qty 2

## 2021-08-11 MED ORDER — MORPHINE SULFATE (PF) 4 MG/ML IV SOLN
4.0000 mg | Freq: Once | INTRAVENOUS | Status: DC
  Filled 2021-08-11: qty 1

## 2021-08-11 NOTE — Discharge Instructions (Signed)
Please return to the emergency department for any new, worsening, or changing symptoms or other concerns including weakness in your legs, urinary or stool incontinence or retention, numbness or tingling in your extremities/buttocks/groin, fevers, or any other concerns or change in symptoms.  

## 2021-08-11 NOTE — ED Notes (Signed)
Patient was given a warm blanket. Patient states he has a ride home. ?

## 2021-08-11 NOTE — ED Provider Notes (Signed)
? ?[facility] ?Provider Note ? ? ? Event Date/Time  ? First MD Initiated Contact with Patient 08/11/21 1928   ?  (approximate) ? ? ?History  ? ?Back Pain and Rib Injury ? ? ?HPI ? ?Patrick [name] is a 46 y.o. male with a past medical history of generalized anxiety disorder with panic attacks, chronic lumbar radiculopathy who presents today for evaluation of low back pain.  Patient reports that he had a fall in 2019 and has had chronic pain since then.  He reports that the fall resulted in 7 herniated disks.  He reports that he is on disability and does not work anymore because of this injury.  He reports that he ran out of his pain medication 2 days ago and his pain is worsened since that time.  He reports that his pain feels exactly the same as his normal back pain.  He denies any urinary or fecal incontinence or retention.  He denies saddle anesthesia.  No fevers or history of IV drug use.  He is still able to ambulate, though has pain with ambulation.  No leg weakness.  No chest pain or shortness of breath. ? ?Patient Active Problem List  ? Diagnosis Date Noted  ? Generalized anxiety disorder with panic attacks 08/06/2021  ? Chronic lumbar radiculopathy 07/18/2021  ? ? ? ?  ? ? ?Physical Exam  ? ?Triage Vital Signs: ?ED Triage Vitals  ?Enc Vitals Group  ?   BP 08/11/21 1833 (!) 158/144  ?   Pulse Rate 08/11/21 1833 (!) 132  ?   Resp 08/11/21 1833 18  ?   Temp 08/11/21 1833 98.6 ?F (37 ?C)  ?   Temp src --   ?   SpO2 08/11/21 1833 95 %  ?   Weight --   ?   Height --   ?   Head Circumference --   ?   Peak Flow --   ?   Pain Score 08/11/21 1829 10  ?   Pain Loc --   ?   Pain Edu? --   ?   Excl. in GC? --   ? ? ?Most recent vital signs: ?Vitals:  ? 08/11/21 2100 08/11/21 2341  ?BP: 106/86 111/87  ?Pulse: 84 77  ?Resp:  16  ?Temp:    ?SpO2: 94% 96%  ? ? ?General: Awake, no distress.  ?CV:  Good peripheral perfusion.  ?Resp:  Normal effort.  ?Abd:  No distention. No abdominal  tenderness. ?Other:  Back: No midline tenderness. Diffuse lumbar tenderness. Strength and sensation 5/5 to bilateral lower extremities. Normal great toe extension against resistance. Normal sensation throughout feet. Normal patellar reflexes. Positive SLR on left and negative opposite SLR bilaterally. Negative FABER test ? ? ?ED Results / Procedures / Treatments  ? ?Labs ?(all labs ordered are listed, but only abnormal results are displayed) ?Labs Reviewed  ?COMPREHENSIVE METABOLIC PANEL - Abnormal; Notable for the following components:  ?    Result Value  ? Glucose, Bld 105 (*)   ? Total Bilirubin 1.3 (*)   ? All other components within normal limits  ?URINALYSIS, ROUTINE W REFLEX MICROSCOPIC - Abnormal; Notable for the following components:  ? Color, Urine YELLOW (*)   ? APPearance CLEAR (*)   ? Ketones, ur 80 (*)   ? All other components within normal limits  ?CBC WITH DIFFERENTIAL/PLATELET  ?D-DIMER, QUANTITATIVE  ? ? ? ?EKG ? ? ? ? ?RADIOLOGY ? ? ? ? ?PROCEDURES: ? ?Critical  Care performed:  ? ?Procedures ? ? ?MEDICATIONS ORDERED IN ED: ?Medications  ?lidocaine (LIDODERM) 5 % 1 patch (1 patch Transdermal Patch Applied 08/11/21 2055)  ?ketorolac (TORADOL) 15 MG/ML injection 15 mg (15 mg Intravenous Given 08/11/21 2053)  ?sodium chloride 0.9 % bolus 1,000 mL (0 mLs Intravenous Stopped 08/11/21 2304)  ?morphine (PF) 2 MG/ML injection 2 mg (2 mg Intravenous Given 08/11/21 2248)  ?ondansetron (ZOFRAN) injection 4 mg (4 mg Intravenous Given 08/11/21 2300)  ? ? ? ?IMPRESSION / MDM / ASSESSMENT AND PLAN / ED COURSE  ?I reviewed the triage vital signs and the nursing notes. ? ?Differential diagnosis includes, but is not limited to, acute on chronic back pain, radiculopathy,, MSK.  No rib pain per triage note.  He has a history of back pain and presents with back pain that feels the same as his previous back pain since 2018.  He has 5 out of 5 strength with intact sensation to extensor hallucis dorsiflexion and plantarflexion of  bilateral lower extremities with normal patellar reflexes bilaterally. Most likely etiology at this point is muscle strain vs herniated disc. No red flags to indicate patient is at risk for more auspicious process that would require urgent/emergent spinal imaging or subspecialty evaluation at this time. No major trauma, no midline tenderness, no history or physical exam findings to suggest cauda equina syndrome or spinal cord compression. No focal neurological deficits on exam. No constitutional symptoms or history of immunosuppression or IVDA to suggest potential for epidural abscess. Not anticoagulated, no history of bleeding diastasis to suggest risk for epidural hematoma. No chronic steroid use or advanced age or history of malignancy to suggest proclivity towards pathological fracture.  No abdominal pain or flank pain to suggest kidney stone, no history of kidney stone.  No fever or dysuria or CVAT to suggest pyelonephritis .  No chest pain, back pain, shortness of breath, neurological deficits, to suggest vascular catastrophe, and pulses are equal in all 4 extremities.  Discussed care instructions and return precautions with patient. Recommended close outpatient follow-up for re-evaluation. Patient agrees with plan of care. Will treat the patient symptomatically as needed for pain control. Will discharge patient to take these medications and return for any worsening or different pain or development of any neurologic symptoms. Educated patient regarding expected time course for back pain to improve and recommended very close outpatient follow-up.  Patient understands and agrees with plan.  Discharged stable condition. ? ?Clinical Course as of 08/11/21 2358  ?Sat Aug 11, 2021  ?2321 Patient reports he feels improved and ready for discharge home [JP]  ?  ?Clinical Course User Index ?[JP] Jamauri Kruzel, Herb Grays, PA-C  ? ? ? ?FINAL CLINICAL IMPRESSION(S) / ED DIAGNOSES  ? ?Final diagnoses:  ?Lumbar radiculopathy  ? ? ? ?Rx  / DC Orders  ? ?ED Discharge Orders   ? ?      Ordered  ?  naproxen (NAPROSYN) 500 MG tablet  2 times daily with meals       ? 08/11/21 2323  ? ?  ?  ? ?  ? ? ? ?Note:  This document was prepared using Dragon voice recognition software and may include unintentional dictation errors. ?  ?Jackelyn Hoehn, PA-C ?08/11/21 2358 ? ?  ?Willy Eddy, MD ?08/12/21 1222 ? ?

## 2021-08-11 NOTE — ED Notes (Signed)
Eliezer Lofts PA-C is aware of blood pressure. ?

## 2021-08-11 NOTE — ED Notes (Signed)
UA specimen sent to lab @2023  ?

## 2021-08-11 NOTE — ED Triage Notes (Signed)
Pt via POV from home. Pt c/o L sided rib pain and lower back pain. States that he had a traumatic fall in 2018 and has had the pain since but states that it got worst last night. Pt is A&Ox4 and NAD ?

## 2021-08-13 ENCOUNTER — Encounter: Payer: Self-pay | Admitting: Family

## 2021-08-13 ENCOUNTER — Telehealth (INDEPENDENT_AMBULATORY_CARE_PROVIDER_SITE_OTHER): Payer: Medicare HMO | Admitting: Family

## 2021-08-13 ENCOUNTER — Telehealth: Payer: Self-pay | Admitting: Family

## 2021-08-13 DIAGNOSIS — F411 Generalized anxiety disorder: Secondary | ICD-10-CM | POA: Diagnosis not present

## 2021-08-13 DIAGNOSIS — M5416 Radiculopathy, lumbar region: Secondary | ICD-10-CM | POA: Diagnosis not present

## 2021-08-13 DIAGNOSIS — F410 Panic disorder [episodic paroxysmal anxiety] without agoraphobia: Secondary | ICD-10-CM

## 2021-08-13 MED ORDER — OXYCODONE-ACETAMINOPHEN 5-325 MG PO TABS: 1.0000 | ORAL_TABLET | Freq: Three times a day (TID) | ORAL | 0 refills | Status: DC

## 2021-08-13 NOTE — Assessment & Plan Note (Signed)
Chronic - Started Lexapro last visit, but pt reports it made him feel "weird", was taking at bedtime but stopped after a week. Continues to request Xanax, does not want other meds at this time. Continue to advise that this is not an option while also taking an opioid. ? ?

## 2021-08-13 NOTE — Assessment & Plan Note (Signed)
Reports speaking to [facility] pain clinic, they want to do spinal injections, but he states he has had in past already without relief, they said they will call him back if someone can prescribe his OXY for him. refilling OXY x 1 week, PDMP checked & verified. ?

## 2021-08-13 NOTE — Progress Notes (Signed)
? ? ?MyChart Video Visit ? ? ? ?Virtual Visit via Video Note  ? ?This visit type was conducted due to national recommendations for restrictions regarding the COVID-19 Pandemic (e.g. social distancing) in an effort to limit this patient's exposure and mitigate transmission in our community. This patient is at least at moderate risk for complications without adequate follow up. This format is felt to be most appropriate for this patient at this time. Physical exam was limited by quality of the video and audio technology used for the visit. CMA was able to get the patient set up on a video visit. ? ?Patient location: Home. Patient and provider in visit ?Provider location: Office ? ?I discussed the limitations of evaluation and management by telemedicine and the availability of in person appointments. The patient expressed understanding and agreed to proceed. ? ?Visit Date: 08/13/2021 ? ?Today's healthcare provider: Dulce Sellar, NP  ? ? ? ?Subjective:  ? ? Patient ID: Patrick [name], male    DOB: 06/06/1975, 46 y.o.   MRN: [id number] ? ?Chief Complaint  ?Patient presents with  ? Back Pain  ?  Mid to lower back pain, constant pain ?Pain in in ribs ?Went to ER on yesterday  ? ? ?HPI ?Pain ?He reports chronic lumbar pain. There was an injury that may have caused the pain, [place] 7 [place]. The pain started 7 years ago and is staying constant. The pain does radiate both legs. The pain is described as aching, burning, sharp, soreness, stabbing, and tingling, is 9/10 in intensity, occurring constantly. Symptoms are worse in the: all the time.  ?Aggravating factors: sitting, standing, and walking He has tried application of heat, application of ice, acetaminophen, NSAIDs, oral steroids, prescription pain relievers, topical anesthetics, chiropractic treatments, physical thearpy, and message therapy with little relief. Requesting refill of his OXY today. Reports speaking to [facility] pain clinic,  they want to do spinal injections, but he states he has had in past already without relief, they said they will call him back if someone can prescribe his OXY for him. ?Anxiety/Depression: Patient complains of anxiety disorder.   ?He has the following symptoms: chest pain, difficulty concentrating, feelings of losing control, insomnia, irritable, palpitations, racing thoughts, shortness of breath, sweating.  ?Onset of symptoms was approximately  years ago, He denies current suicidal and homicidal ideation.  ?Possible organic causes contributing are: none.  ?Risk factors: negative life event history of family abuse, medical injuries, chronic pain.  ?Previous treatment includes Xanax. Started Lexapro last visit, but pt reports it made him feel "weird", was taking at bedtime but stopped after a week. Continues to request Xanax, does not want other meds at this time. ? ?Assessment & Plan:  ? ?Problem List Items Addressed This Visit   ? ?  ? Nervous and Auditory  ? Chronic lumbar radiculopathy - Primary  ?  Reports speaking to [facility] pain clinic, they want to do spinal injections, but he states he has had in past already without relief, they said they will call him back if someone can prescribe his OXY for him. refilling OXY x 1 week, PDMP checked & verified. ?  ?  ? Relevant Medications  ? oxyCODONE-acetaminophen (PERCOCET/ROXICET) 5-325 MG tablet  ?  ? Other  ? Generalized anxiety disorder with panic attacks  ?  Chronic - Started Lexapro last visit, but pt reports it made him feel "weird", was taking at bedtime but stopped after a week. Continues to request Xanax,  does not want other meds at this time. Continue to advise that this is not an option while also taking an opioid. ? ?  ?  ? ? ?Past Medical History:  ?Diagnosis Date  ? Depression   ? Herniated cervical disc   ? Neuromuscular disorder (HCC)   ? ? ?History reviewed. No pertinent surgical history. ? ?Outpatient Medications Prior to Visit  ?Medication Sig Dispense  Refill  ? ALPRAZolam (XANAX) 0.25 MG tablet Take 1 tablet (0.25 mg total) by mouth 3 (three) times daily as needed for anxiety. 5 tablet 0  ? escitalopram (LEXAPRO) 5 MG tablet Take 1 tablet (5 mg total) by mouth as directed. START with 1 pill daily for 1-2 weeks, then increase to 2 pills daily if no improvement in anxiety. 60 tablet 0  ? naproxen (NAPROSYN) 500 MG tablet Take 1 tablet (500 mg total) by mouth 2 (two) times daily with a meal. 14 tablet 2  ? oxyCODONE-acetaminophen (PERCOCET/ROXICET) 5-325 MG tablet Take 1 tablet by mouth in the morning, at noon, and at bedtime. (Patient not taking: Reported on 08/13/2021) 21 tablet 0  ? ?No facility-administered medications prior to visit.  ? ? ?Allergies  ?Allergen Reactions  ? Ativan [Lorazepam] Anxiety  ? ? ?   ?Objective:  ?  ? ?Physical Exam ?Vitals and nursing note reviewed.  ?Constitutional:   ?   General: She is not in acute distress. ?   Appearance: Normal appearance.  ?HENT:  ?   Head: Normocephalic.  ?Pulmonary:  ?   Effort: No respiratory distress.  ?Musculoskeletal:  ?   Cervical back: Normal range of motion.  ?Skin: ?   General: Skin is dry.  ?   Coloration: Skin is not pale.  ?Neurological:  ?   Mental Status: She is alert and oriented to person, place, and time.  ?Psychiatric:     ?   Mood and Affect: Mood normal.  ? ?There were no vitals taken for this visit. ? ?Wt Readings from Last 3 Encounters:  ?08/06/21 186 lb (84.4 kg)  ?07/19/21 186 lb 2 oz (84.4 kg)  ?07/18/21 186 lb 2 oz (84.4 kg)  ? ? ?I discussed the assessment and treatment plan with the patient. The patient was provided an opportunity to ask questions and all were answered. The patient agreed with the plan and demonstrated an understanding of the instructions. ?  ?The patient was advised to call back or seek an in-person evaluation if the symptoms worsen or if the condition fails to improve as anticipated. ? ?I provided 22 minutes of face-to-face time during this encounter. ? ?Dulce Sellar, NP ?[facility] ?[phone] (phone) ?[phone] (fax) ? ?[facility]  ?

## 2021-08-13 NOTE — Telephone Encounter (Signed)
prescription sent, thx

## 2021-08-13 NOTE — Telephone Encounter (Signed)
Patient called stating he received a call from pain management clinic - he spoke to [facility] and and was going to check with vivki to see if dr Andree Elk can see him - patient does not want to wait any longer and wants refill for back pain now -  ?

## 2021-08-22 ENCOUNTER — Telehealth: Payer: Self-pay

## 2021-08-22 ENCOUNTER — Telehealth: Payer: Medicare HMO | Admitting: Family

## 2021-08-22 NOTE — Telephone Encounter (Signed)
I called pt. Pt states he will not be able to come in to give a urine. Will come tomorrow. Let pt know it will take 2-3 days for drug screen to come back And Judeth Cornfield can not refill RX until pt comes in for a visit and give urine. ?

## 2021-08-22 NOTE — Telephone Encounter (Signed)
-----   Message from Dulce Sellar, NP sent at 08/21/2021  6:53 PM EDT ----- ?Regarding: televisit today ?Hey Deja, ? ?Please call Bakary and let him know he has to come in to the office today and provide a urine sample for testing before I can fill his prescription again, he can still do a televisit. ? ?

## 2021-09-01 ENCOUNTER — Other Ambulatory Visit: Payer: Self-pay | Admitting: Family

## 2021-09-01 DIAGNOSIS — F410 Panic disorder [episodic paroxysmal anxiety] without agoraphobia: Secondary | ICD-10-CM

## 2021-09-13 ENCOUNTER — Encounter: Payer: Self-pay | Admitting: Anesthesiology

## 2021-09-13 ENCOUNTER — Ambulatory Visit: Payer: Medicare HMO | Attending: Anesthesiology | Admitting: Anesthesiology

## 2021-09-13 ENCOUNTER — Other Ambulatory Visit: Payer: Self-pay

## 2021-09-13 DIAGNOSIS — F1190 Opioid use, unspecified, uncomplicated: Secondary | ICD-10-CM | POA: Diagnosis present

## 2021-09-13 DIAGNOSIS — M5442 Lumbago with sciatica, left side: Secondary | ICD-10-CM

## 2021-09-13 DIAGNOSIS — M47816 Spondylosis without myelopathy or radiculopathy, lumbar region: Secondary | ICD-10-CM

## 2021-09-13 DIAGNOSIS — G8929 Other chronic pain: Secondary | ICD-10-CM | POA: Diagnosis present

## 2021-09-13 DIAGNOSIS — M542 Cervicalgia: Secondary | ICD-10-CM | POA: Diagnosis present

## 2021-09-13 DIAGNOSIS — R202 Paresthesia of skin: Secondary | ICD-10-CM

## 2021-09-13 DIAGNOSIS — G894 Chronic pain syndrome: Secondary | ICD-10-CM | POA: Diagnosis present

## 2021-09-13 DIAGNOSIS — R29898 Other symptoms and signs involving the musculoskeletal system: Secondary | ICD-10-CM

## 2021-09-13 DIAGNOSIS — R200 Anesthesia of skin: Secondary | ICD-10-CM | POA: Diagnosis present

## 2021-09-13 DIAGNOSIS — M545 Low back pain, unspecified: Secondary | ICD-10-CM | POA: Insufficient documentation

## 2021-09-13 NOTE — Progress Notes (Signed)
Safety precautions to be maintained throughout the outpatient stay will include: orient to surroundings, keep bed in low position, maintain call bell within reach at all times, provide assistance with transfer out of bed and ambulation.  

## 2021-09-14 DIAGNOSIS — M542 Cervicalgia: Secondary | ICD-10-CM | POA: Insufficient documentation

## 2021-09-14 NOTE — Progress Notes (Signed)
Subjective:  Patient ID: Patrick HeringLuis [name], male    DOB: October 31, 1975  Age: 46 y.o. MRN: [id number]  CC: Back Pain (Low , mid and upper.)     PROCEDURE: None  HPI Patrick HeringLuis [name] presents for new patient evaluation.  He is a pleasant 46 year old Hispanic male with a 7-year history of low back pain and neck pain.  He is originally from [place] and is attempting to establish care here in the [place] region.  He moved here a few weeks ago.  He complains of chronic low back pain and some neck pain additionally.  He has been seen in the emergency room for these conditions.  He has had a history of a fall sustained about 7 years ago while working [place] [employer] [place].  He fell through a glass structure 30 feet and sustained diffuse injury to his back.  A previous MRI is on file and was reviewed with him today.  He does have evidence of degenerative disc disease with foraminal stenosis.  He has had 2 previous lumbar x-rays in the ER as well.  While in [place], he was on chronic opioid therapy reportedly had 10 mg Percocet 4 times a day with good response to therapy no side effects and good relief.  He felt that he was functional and active with the medication.  He has had difficulty establishing provided coverage here and subsequently is out of medicine and incapacitated as reported today.  He describes a chronic aching gnawing pain in the low back with radiation to the hips buttocks and down the legs.  He has significant pain affecting the posterior lateral aspect of the left leg and difficulty ambulating.  He had a surgical evaluation in [place] as reported today and states that the surgeon told him that he was having risk for postsurgical complications.  The pain limits his daily activity his ability to work and he is currently on disability.  It affects all aspects of his life and he feels that he was much more functional with the medication and able to lead a more normalize life whereas now he is fully  incapacitated and in constant pain.  The pain causes him to be depressed and frustrated and he cannot sleep at night.  History Patrick SpeakLuis has a past medical history of Depression, Herniated cervical disc, and Neuromuscular disorder (HCC).   He has no past surgical history on file.   His family history is not on file.He reports that he has been smoking cigarettes. He has a 0.75 pack-year smoking history. He has never used smokeless tobacco. He reports that he does not drink alcohol and does not use drugs.  No valid procedures specified.  No results found for: "TOXASSSELUR"  Outpatient Medications Prior to Visit  Medication Sig Dispense Refill   acetaminophen (TYLENOL) 500 MG tablet Take 1,000 mg by mouth every 4 (four) hours as needed.     Capsaicin-Menthol (SALONPAS GEL EX) Apply 4 % topically as needed (applies to back).     ALPRAZolam (XANAX) 0.25 MG tablet Take 1 tablet (0.25 mg total) by mouth 3 (three) times daily as needed for anxiety. (Patient not taking: Reported on 09/13/2021) 5 tablet 0   escitalopram (LEXAPRO) 5 MG tablet START WITH 1 PILL DAILY FOR 1-2 WEEKS, THEN INCREASE TO 2 PILLS DAILY IF NO IMPROVEMENT IN ANXIETY. (Patient not taking: Reported on 09/13/2021) 90 tablet 1   naproxen (NAPROSYN) 500 MG tablet Take 1 tablet (500 mg total) by mouth  2 (two) times daily with a meal. (Patient not taking: Reported on 09/13/2021) 14 tablet 2   naproxen sodium (ALEVE) 220 MG tablet Take 660 mg by mouth every 6 (six) hours. (Patient not taking: Reported on 09/13/2021)     oxyCODONE-acetaminophen (PERCOCET/ROXICET) 5-325 MG tablet Take 1 tablet by mouth in the morning, at noon, and at bedtime. (Patient not taking: Reported on 09/13/2021) 21 tablet 0   No facility-administered medications prior to visit.   Lab Results  Component Value Date   WBC 9.0 08/11/2021   HGB 15.5 08/11/2021   HCT 47.4 08/11/2021   PLT 273 08/11/2021   GLUCOSE 105 (H) 08/11/2021   ALT 14 08/11/2021   AST 18 08/11/2021   NA  141 08/11/2021   K 4.0 08/11/2021   CL 107 08/11/2021   CREATININE 0.84 08/11/2021   BUN 7 08/11/2021   CO2 25 08/11/2021    --------------------------------------------------------------------------------------------------------------------- No results found.     ---------------------------------------------------------------------------------------------------------------------- Past Medical History:  Diagnosis Date   Depression    Herniated cervical disc    Neuromuscular disorder (HCC)     History reviewed. No pertinent surgical history.  History reviewed. No pertinent family history.  Social History   Tobacco Use   Smoking status: Every Day    Packs/day: 0.25    Years: 3.00    Total pack years: 0.75    Types: Cigarettes   Smokeless tobacco: Never  Substance Use Topics   Alcohol use: Never    ---------------------------------------------------------------------------------------------------------------------  Scheduled Meds: Continuous Infusions: PRN Meds:.   BP 101/77 (BP Location: Right Arm, Patient Position: Prone, Cuff Size: Normal)   Pulse 73   Temp (!) 97.3 F (36.3 C) (Temporal)   Resp 18   Ht 6\' 1"  (1.854 m)   Wt 185 lb (83.9 kg)   SpO2 97%   BMI 24.41 kg/m    BP Readings from Last 3 Encounters:  09/13/21 101/77  08/11/21 111/87  07/19/21 107/78     Wt Readings from Last 3 Encounters:  09/13/21 185 lb (83.9 kg)  08/06/21 186 lb (84.4 kg)  07/19/21 186 lb 2 oz (84.4 kg)     ----------------------------------------------------------------------------------------------------------------------  ROS Review of Systems CNS: No headaches or confusion Cardiac: No angina or palpitations Pulmonary: No shortness of breath or dyspnea GI: No constipation or nausea vomiting or diarrhea Musculoskeletal: As above with intermittent neck and shoulder pain with low back pain and sciatica symptoms affecting the left leg  Objective:  BP 101/77  (BP Location: Right Arm, Patient Position: Prone, Cuff Size: Normal)   Pulse 73   Temp (!) 97.3 F (36.3 C) (Temporal)   Resp 18   Ht 6\' 1"  (1.854 m)   Wt 185 lb (83.9 kg)   SpO2 97%   BMI 24.41 kg/m   Physical Exam Patient is alert oriented cooperative and a good historian. Pupils are equally round reactive to light extraocular muscles intact Cranial nerves II through XII appear grossly intact Heart is regular rate and rhythm without murmur or gallop Inspection of low back reveals some paraspinous muscle tenderness but no overt trigger points.  He has diffuse tenderness and difficulty going from seated to standing with obvious and severe pain.  Any type of movement about the bed appears to cause severe discomfort.  He has difficulty sitting up and takes significant time going from supine to a standing position.  He uses a cane for support and ambulation.  He is virtually unable to extend at the low back while in  the standing position any type of rotational motion exacerbates his pain.  He has a positive straight leg raise on the left side negative on the right.  Muscle tone and bulk is good.  His strength appears to be well preserved both proximal and distal with flexion extension at the hip flexors knees and feet.     Assessment & Plan:   Dreshon was seen today for back pain.  Diagnoses and all orders for this visit:  Chronic pain syndrome -     ToxASSURE Select 13 (MW), Urine  Chronic bilateral low back pain with left-sided sciatica -     Ambulatory referral to Neurosurgery  Chronic, continuous use of opioids -     ToxASSURE Select 13 (MW), Urine  Facet arthritis of lumbar region  Left leg weakness -     Ambulatory referral to Neurosurgery  Numbness and tingling of left leg -     Ambulatory referral to Neurosurgery  Cervicalgia     ----------------------------------------------------------------------------------------------------------------------  Problem List  Items Addressed This Visit       Unprioritized   Cervicalgia   Chronic pain syndrome   Relevant Medications   naproxen sodium (ALEVE) 220 MG tablet   acetaminophen (TYLENOL) 500 MG tablet   Other Relevant Orders   ToxASSURE Select 13 (MW), Urine   Chronic, continuous use of opioids   Relevant Orders   ToxASSURE Select 13 (MW), Urine   Facet arthritis of lumbar region   Relevant Medications   naproxen sodium (ALEVE) 220 MG tablet   acetaminophen (TYLENOL) 500 MG tablet   Left leg weakness   Relevant Orders   Ambulatory referral to Neurosurgery   Low back pain   Relevant Medications   naproxen sodium (ALEVE) 220 MG tablet   acetaminophen (TYLENOL) 500 MG tablet   Other Relevant Orders   Ambulatory referral to Neurosurgery   Numbness and tingling of left leg   Relevant Orders   Ambulatory referral to Neurosurgery    ----------------------------------------------------------------------------------------------------------------------  1. Chronic pain syndrome Kaelen has a very difficult situation.  He has failed conservative therapy.  He has had previous injection therapy described as epidural injections and facet injections although this is unavailable to me at this time for review.  Furthermore he does not desire to proceed with any further interventional therapy.  He was deemed a nonsurgical candidate though I have encouraged him to acquire a repeat neurosurgical evaluation and has tried to assist by requesting this today.  He has done well with previous opioid therapy and would be a candidate for this as he has failed conservative therapy and physical therapy and is virtually incapacitated at this point.  A routine urine screen is requested and he may be a candidate for Percocet 10 mg 3 times a day as reviewed with him today.  In the past he has gotten excellent relief from this without side effect and denies any diverting or illicit use.  He denies any other opioid or illicit drug  use. - ToxASSURE Select 13 (MW), Urine  2. Chronic bilateral low back pain with left-sided sciatica As above with severe incapacitating low back pain and left lower leg sciatica consistent with MRI findings - Ambulatory referral to Neurosurgery  3. Chronic, continuous use of opioids As above - ToxASSURE Select 13 (MW), Urine  4. Facet arthritis of lumbar region Continue core stretching strengthening exercises as reviewed today.  He is limited in his ability to do these.  5. Left leg weakness As above - Ambulatory referral  to Neurosurgery  6. Numbness and tingling of left leg As above - Ambulatory referral to Neurosurgery  7. Cervicalgia     ----------------------------------------------------------------------------------------------------------------------  I am having Patrick [name] maintain his ALPRAZolam, naproxen, oxyCODONE-acetaminophen, escitalopram, naproxen sodium, acetaminophen, and Capsaicin-Menthol (SALONPAS GEL EX).   No orders of the defined types were placed in this encounter.      Follow-up: Return in about 2 weeks (around 09/27/2021) for evaluation, med refill virtual.    Yevette Edwards, MD 9:46 AM  The [place] practitioner database for opioid medications on this patient has been reviewed by me and my staff   Greater than 50% of the total encounter time was spent in counseling and / or coordination of care.     This dictation was performed utilizing Conservation officer, historic buildings.  Please excuse any unintentional or mistaken typographical errors as a result.

## 2021-09-18 LAB — TOXASSURE SELECT 13 (MW), URINE

## 2021-09-27 ENCOUNTER — Encounter: Payer: Self-pay | Admitting: Anesthesiology

## 2021-09-27 ENCOUNTER — Ambulatory Visit: Payer: Medicare HMO | Attending: Anesthesiology | Admitting: Anesthesiology

## 2021-09-27 DIAGNOSIS — G894 Chronic pain syndrome: Secondary | ICD-10-CM | POA: Diagnosis not present

## 2021-09-27 DIAGNOSIS — M5442 Lumbago with sciatica, left side: Secondary | ICD-10-CM | POA: Diagnosis not present

## 2021-09-27 DIAGNOSIS — G8929 Other chronic pain: Secondary | ICD-10-CM

## 2021-09-27 DIAGNOSIS — F1190 Opioid use, unspecified, uncomplicated: Secondary | ICD-10-CM

## 2021-09-27 DIAGNOSIS — M47816 Spondylosis without myelopathy or radiculopathy, lumbar region: Secondary | ICD-10-CM | POA: Diagnosis not present

## 2021-09-27 DIAGNOSIS — R202 Paresthesia of skin: Secondary | ICD-10-CM

## 2021-09-27 DIAGNOSIS — M542 Cervicalgia: Secondary | ICD-10-CM

## 2021-09-27 DIAGNOSIS — R200 Anesthesia of skin: Secondary | ICD-10-CM

## 2021-09-27 DIAGNOSIS — R29898 Other symptoms and signs involving the musculoskeletal system: Secondary | ICD-10-CM

## 2021-09-27 MED ORDER — OXYCODONE-ACETAMINOPHEN 7.5-325 MG PO TABS: 1.0000 | ORAL_TABLET | Freq: Three times a day (TID) | ORAL | 0 refills | Status: DC | PRN

## 2021-09-27 MED ORDER — METHOCARBAMOL 750 MG PO TABS: 750.0000 mg | ORAL_TABLET | Freq: Three times a day (TID) | ORAL | 1 refills | Status: AC | PRN

## 2021-10-01 ENCOUNTER — Other Ambulatory Visit: Payer: Self-pay | Admitting: *Deleted

## 2021-10-01 ENCOUNTER — Encounter: Payer: Self-pay | Admitting: *Deleted

## 2021-10-17 ENCOUNTER — Encounter: Payer: Self-pay | Admitting: Anesthesiology

## 2021-10-17 ENCOUNTER — Ambulatory Visit: Payer: Medicare HMO | Attending: Anesthesiology | Admitting: Anesthesiology

## 2021-10-17 DIAGNOSIS — M5442 Lumbago with sciatica, left side: Secondary | ICD-10-CM

## 2021-10-17 DIAGNOSIS — M542 Cervicalgia: Secondary | ICD-10-CM

## 2021-10-17 DIAGNOSIS — R200 Anesthesia of skin: Secondary | ICD-10-CM

## 2021-10-17 DIAGNOSIS — R202 Paresthesia of skin: Secondary | ICD-10-CM

## 2021-10-17 DIAGNOSIS — M47816 Spondylosis without myelopathy or radiculopathy, lumbar region: Secondary | ICD-10-CM | POA: Diagnosis not present

## 2021-10-17 DIAGNOSIS — R29898 Other symptoms and signs involving the musculoskeletal system: Secondary | ICD-10-CM

## 2021-10-17 DIAGNOSIS — F1190 Opioid use, unspecified, uncomplicated: Secondary | ICD-10-CM | POA: Diagnosis not present

## 2021-10-17 DIAGNOSIS — G894 Chronic pain syndrome: Secondary | ICD-10-CM | POA: Diagnosis not present

## 2021-10-17 DIAGNOSIS — G8929 Other chronic pain: Secondary | ICD-10-CM

## 2021-10-17 MED ORDER — OXYCODONE-ACETAMINOPHEN 7.5-325 MG PO TABS: 1.0000 | ORAL_TABLET | Freq: Three times a day (TID) | ORAL | 0 refills | Status: DC | PRN

## 2021-10-17 NOTE — Progress Notes (Signed)
Virtual Visit via Telephone Note  I connected with Patrick [name] on 10/17/21 at  9:15 AM EDT by telephone and verified that I am speaking with the correct person using two identifiers.  Location: Patient: Home Provider: Pain control center   I discussed the limitations, risks, security and privacy concerns of performing an evaluation and management service by telephone and the availability of in person appointments. I also discussed with the patient that there may be a patient responsible charge related to this service. The patient expressed understanding and agreed to proceed.   History of Present Illness: I spoke with Patrick [name] today via telephone as we were unable to link for the video portion of the conference but he reports that he is doing well and has much better pain control now that he is restarted his Percocet.  He is taking this at the 7.5 mg strength 3 times a day.  No side effects are reported.  He feels like he is more active and able to tolerate the pain better and sleeps better at night.  No side effects reported.  The quality characteristic and distribution of his pain is stable in nature.  No changes in upper or lower extremity strength function or bowel or bladder function are noted.  Review of systems: General: No fevers or chills Pulmonary: No shortness of breath or dyspnea Cardiac: No angina or palpitations or lightheadedness GI: No abdominal pain or constipation Psych: No depression    Observations/Objective:  Current Outpatient Medications:    acetaminophen (TYLENOL) 500 MG tablet, Take 1,000 mg by mouth every 4 (four) hours as needed., Disp: , Rfl:    ALPRAZolam (XANAX) 0.25 MG tablet, Take 1 tablet (0.25 mg total) by mouth 3 (three) times daily as needed for anxiety. (Patient not taking: Reported on 09/13/2021), Disp: 5 tablet, Rfl: 0   Capsaicin-Menthol (SALONPAS GEL EX), Apply 4 % topically as needed (applies to back)., Disp: , Rfl:    escitalopram (LEXAPRO) 5  MG tablet, START WITH 1 PILL DAILY FOR 1-2 WEEKS, THEN INCREASE TO 2 PILLS DAILY IF NO IMPROVEMENT IN ANXIETY. (Patient not taking: Reported on 09/13/2021), Disp: 90 tablet, Rfl: 1   methocarbamol (ROBAXIN-750) 750 MG tablet, Take 1 tablet (750 mg total) by mouth every 8 (eight) hours as needed for muscle spasms., Disp: 60 tablet, Rfl: 1   naproxen (NAPROSYN) 500 MG tablet, Take 1 tablet (500 mg total) by mouth 2 (two) times daily with a meal. (Patient not taking: Reported on 09/13/2021), Disp: 14 tablet, Rfl: 2   naproxen sodium (ALEVE) 220 MG tablet, Take 660 mg by mouth every 6 (six) hours. (Patient not taking: Reported on 09/13/2021), Disp: , Rfl:    [START ON 10/27/2021] oxyCODONE-acetaminophen (PERCOCET) 7.5-325 MG tablet, Take 1 tablet by mouth every 8 (eight) hours as needed for moderate pain or severe pain., Disp: 90 tablet, Rfl: 0   oxyCODONE-acetaminophen (PERCOCET/ROXICET) 5-325 MG tablet, Take 1 tablet by mouth in the morning, at noon, and at bedtime. (Patient not taking: Reported on 09/13/2021), Disp: 21 tablet, Rfl: 0   Past Medical History:  Diagnosis Date   Depression    Herniated cervical disc    Neuromuscular disorder (HCC)      Assessment and Plan: 1. Chronic pain syndrome   2. Chronic bilateral low back pain with left-sided sciatica   3. Chronic, continuous use of opioids   4. Facet arthritis of lumbar region   5. Left leg weakness   6. Numbness and tingling of left leg  7. Cervicalgia   Greycen sounds like he is doing well with the regimen and tolerating his medications appropriately without side effect.  He is getting good improvement in his overall lifestyle function with no side effects reported.  Want him to continue with the stretching strengthening exercises and walking that he is currently doing and we will schedule him for a 1 month return to clinic.  Continue follow-up with his primary care physicians for baseline medical care.  Follow Up Instructions:    I discussed  the assessment and treatment plan with the patient. The patient was provided an opportunity to ask questions and all were answered. The patient agreed with the plan and demonstrated an understanding of the instructions.   The patient was advised to call back or seek an in-person evaluation if the symptoms worsen or if the condition fails to improve as anticipated.  I provided 20 minutes of non-face-to-face time during this encounter.   Yevette Edwards, MD

## 2021-10-28 ENCOUNTER — Emergency Department
Admission: EM | Admit: 2021-10-28 | Discharge: 2021-10-29 | Discharge status: Against Medical Advice | Payer: Medicare HMO

## 2021-11-03 ENCOUNTER — Other Ambulatory Visit: Payer: Self-pay

## 2021-11-03 ENCOUNTER — Encounter: Payer: Self-pay | Admitting: Emergency Medicine

## 2021-11-03 ENCOUNTER — Emergency Department
Admission: EM | Admit: 2021-11-03 | Discharge: 2021-11-03 | Disposition: A | Discharge status: Home / Self Care | Payer: Medicare HMO | Attending: Emergency Medicine | Admitting: Emergency Medicine

## 2021-11-03 DIAGNOSIS — F410 Panic disorder [episodic paroxysmal anxiety] without agoraphobia: Secondary | ICD-10-CM | POA: Diagnosis present

## 2021-11-03 DIAGNOSIS — F4310 Post-traumatic stress disorder, unspecified: Secondary | ICD-10-CM

## 2021-11-03 MED ORDER — ALPRAZOLAM 0.5 MG PO TABS
1.0000 mg | ORAL_TABLET | Freq: Once | ORAL | Status: AC
  Administered 2021-11-03: 1 mg via ORAL
  Filled 2021-11-03: qty 2

## 2021-11-03 MED ORDER — ALPRAZOLAM 0.25 MG PO TABS: 0.2500 mg | ORAL_TABLET | Freq: Three times a day (TID) | ORAL | 0 refills | Status: AC | PRN

## 2021-11-03 NOTE — Discharge Instructions (Signed)
You may take Alprazolam as needed for panic attacks.  Return to the ER for worsening symptoms, feelings of hurting yourself or others, or other concerns.

## 2021-11-03 NOTE — ED Triage Notes (Signed)
Pt reports he has PTSD pt reports he was watching TV when his panic attack started. Pt denies any alcohol intake, denies nay use of recreational drugs.

## 2021-11-03 NOTE — ED Notes (Signed)
Pt reports his anxiety is improving at this time. Pleasant, calm, and cooperative. Now anxiety controlled enough for him to sit in bed rather than pace in room. Offered blankets and drink, declined these.

## 2021-11-03 NOTE — ED Provider Notes (Signed)
[facility] Provider Note    Event Date/Time   First MD Initiated Contact with Patient 11/03/21 0303     (approximate)   History   Panic Attack   HPI  Patrick [name] is a 46 y.o. male who presents to the ED from home with a chief complaint of panic attack.  Has a history of PTSD prescribed alprazolam.  Ran out of it and had a panic attack tonight while he was watching TV.  Denies active SI/HI/AH/VH.  Voices no other complaints or injuries.     Past Medical History   Past Medical History:  Diagnosis Date  . Depression   . Herniated cervical disc   . Neuromuscular disorder [facility])      Active Problem List   Patient Active Problem List   Diagnosis Date Noted  . Cervicalgia 09/14/2021  . Chronic pain syndrome 09/13/2021  . Low back pain 09/13/2021  . Chronic, continuous use of opioids 09/13/2021  . Facet arthritis of lumbar region 09/13/2021  . Left leg weakness 09/13/2021  . Numbness and tingling of left leg 09/13/2021  . Generalized anxiety disorder with panic attacks 08/06/2021  . Chronic lumbar radiculopathy 07/18/2021     Past Surgical History  No past surgical history on file.   Home Medications   Prior to Admission medications   Medication Sig Start Date End Date Taking? Authorizing Provider  acetaminophen (TYLENOL) 500 MG tablet Take 1,000 mg by mouth every 4 (four) hours as needed.    [provider]  ALPRAZolam Prudy Feeler) 0.25 MG tablet Take 1 tablet (0.25 mg total) by mouth 3 (three) times daily as needed for anxiety. 11/03/21 11/03/22  Irean Hong, MD  Capsaicin-Menthol (SALONPAS GEL EX) Apply 4 % topically as needed (applies to back).    [provider]  escitalopram (LEXAPRO) 5 MG tablet START WITH 1 PILL DAILY FOR 1-2 WEEKS, THEN INCREASE TO 2 PILLS DAILY IF NO IMPROVEMENT IN ANXIETY. Patient not taking: Reported on 09/13/2021 09/04/21   Dulce Sellar, NP  naproxen (NAPROSYN) 500 MG tablet Take 1 tablet (500  mg total) by mouth 2 (two) times daily with a meal. Patient not taking: Reported on 09/13/2021 08/11/21   Poggi, Eileen Stanford E, PA-C  naproxen sodium (ALEVE) 220 MG tablet Take 660 mg by mouth every 6 (six) hours. Patient not taking: Reported on 09/13/2021    [provider]  oxyCODONE-acetaminophen (PERCOCET) 7.5-325 MG tablet Take 1 tablet by mouth every 8 (eight) hours as needed for moderate pain or severe pain. 10/27/21 11/26/21  Yevette Edwards, MD  oxyCODONE-acetaminophen (PERCOCET/ROXICET) 5-325 MG tablet Take 1 tablet by mouth in the morning, at noon, and at bedtime. Patient not taking: Reported on 09/13/2021 08/13/21   Dulce Sellar, NP     Allergies  Ativan [lorazepam]   Family History  No family history on file.   Physical Exam  Triage Vital Signs: ED Triage Vitals  Enc Vitals Group     BP 11/03/21 0258 105/79     Pulse Rate 11/03/21 0258 68     Resp 11/03/21 0258 20     Temp 11/03/21 0258 97.9 F (36.6 C)     Temp Source 11/03/21 0258 Oral     SpO2 11/03/21 0258 97 %     Weight 11/03/21 0300 185 lb (83.9 kg)     Height 11/03/21 0300 6\' 2"  (1.88 m)     Head Circumference --      Peak Flow --  Pain Score 11/03/21 0300 0     Pain Loc --      Pain Edu? --      Excl. in GC? --     Updated Vital Signs: BP 105/88   Pulse 66   Temp 97.9 F (36.6 C) (Oral)   Resp 18   Ht 6\' 2"  (1.88 m)   Wt 83.9 kg   SpO2 100%   BMI 23.75 kg/m    General: Awake, mild distress.  CV:  RRR.  Good peripheral perfusion.  Resp:  Normal effort.  CTA B. Abd:  No distention.  Other:  Anxious.   ED Results / Procedures / Treatments  Labs (all labs ordered are listed, but only abnormal results are displayed) Labs Reviewed - No data to display   EKG  None   RADIOLOGY None   Official radiology report(s): No results found.   PROCEDURES:  Critical Care performed: No  Procedures   MEDICATIONS ORDERED IN ED: Medications  ALPRAZolam (XANAX) tablet 1 mg (1 mg Oral  Given 11/03/21 0327)     IMPRESSION / MDM / ASSESSMENT AND PLAN / ED COURSE  I reviewed the triage vital signs and the nursing notes.                             46 year old male presenting with panic attack.  Has had success with alprazolam in the past.  Will administer 1 mg PO and reassess.  Patient's presentation is most consistent with exacerbation of chronic illness.    Clinical Course as of 11/03/21 0406  Sat Nov 03, 2021  0406 Patient feeling significantly better after oral Xanax.  Strict return precautions given.  Patient verbalized standing and agrees with plan of care. [JS]    Clinical Course User Index [JS] Nov 05, 2021, MD     FINAL CLINICAL IMPRESSION(S) / ED DIAGNOSES   Final diagnoses:  PTSD (post-traumatic stress disorder)  Panic attack     Rx / DC Orders   ED Discharge Orders          Ordered    ALPRAZolam (XANAX) 0.25 MG tablet  3 times daily PRN        11/03/21 0341             Note:  This document was prepared using Dragon voice recognition software and may include unintentional dictation errors.   11/05/21, MD 11/03/21 [phone]

## 2021-11-09 ENCOUNTER — Encounter: Payer: Self-pay | Admitting: Anesthesiology

## 2021-11-09 ENCOUNTER — Ambulatory Visit (HOSPITAL_BASED_OUTPATIENT_CLINIC_OR_DEPARTMENT_OTHER): Payer: Medicare HMO | Admitting: Anesthesiology

## 2021-11-09 DIAGNOSIS — R202 Paresthesia of skin: Secondary | ICD-10-CM

## 2021-11-09 DIAGNOSIS — M5442 Lumbago with sciatica, left side: Secondary | ICD-10-CM | POA: Diagnosis not present

## 2021-11-09 DIAGNOSIS — M47816 Spondylosis without myelopathy or radiculopathy, lumbar region: Secondary | ICD-10-CM | POA: Diagnosis not present

## 2021-11-09 DIAGNOSIS — G894 Chronic pain syndrome: Secondary | ICD-10-CM | POA: Diagnosis not present

## 2021-11-09 DIAGNOSIS — R200 Anesthesia of skin: Secondary | ICD-10-CM

## 2021-11-09 DIAGNOSIS — F1190 Opioid use, unspecified, uncomplicated: Secondary | ICD-10-CM

## 2021-11-09 DIAGNOSIS — M542 Cervicalgia: Secondary | ICD-10-CM

## 2021-11-09 DIAGNOSIS — G8929 Other chronic pain: Secondary | ICD-10-CM

## 2021-11-09 DIAGNOSIS — R29898 Other symptoms and signs involving the musculoskeletal system: Secondary | ICD-10-CM

## 2021-11-09 MED ORDER — OXYCODONE-ACETAMINOPHEN 7.5-325 MG PO TABS: 1.0000 | ORAL_TABLET | Freq: Three times a day (TID) | ORAL | 0 refills | Status: DC | PRN

## 2021-11-13 NOTE — Progress Notes (Signed)
Virtual Visit via Telephone Note  I connected with Patrick [name] on 11/13/21 at  8:55 AM EDT by telephone and verified that I am speaking with the correct person using two identifiers.  Location: Patient: Home Provider: Pain control center   I discussed the limitations, risks, security and privacy concerns of performing an evaluation and management service by telephone and the availability of in person appointments. I also discussed with the patient that there may be a patient responsible charge related to this service. The patient expressed understanding and agreed to proceed.   History of Present Illness: I spoke with Patrick [name] via telephone as we were unable like for the video portion of the conference and he reports that he is doing well with his current opioid regimen.  No side effects are reported and he is getting good relief rated about 75 to 80%.  He is trying his best to stay active and doing his stretching strengthening exercises as tolerated.  The quality characteristic and distribution of his low back pain and leg pain are otherwise unchanged.  Bowel bladder function been stable and he is otherwise doing well.  Take his medications approximately 3 times a day and is describing about 6 hours worth of relief rated at 75% improvement.  And allows him to stay active and functional sleep better at night and he denies any other problems with the medicines.  Review of systems: General: No fevers or chills Pulmonary: No shortness of breath or dyspnea Cardiac: No angina or palpitations or lightheadedness GI: No abdominal pain or constipation Psych: No depression    Observations/Objective:  Current Outpatient Medications:    acetaminophen (TYLENOL) 500 MG tablet, Take 1,000 mg by mouth every 4 (four) hours as needed., Disp: , Rfl:    ALPRAZolam (XANAX) 0.25 MG tablet, Take 1 tablet (0.25 mg total) by mouth 3 (three) times daily as needed for anxiety., Disp: 15 tablet, Rfl: 0    Capsaicin-Menthol (SALONPAS GEL EX), Apply 4 % topically as needed (applies to back)., Disp: , Rfl:    escitalopram (LEXAPRO) 5 MG tablet, START WITH 1 PILL DAILY FOR 1-2 WEEKS, THEN INCREASE TO 2 PILLS DAILY IF NO IMPROVEMENT IN ANXIETY. (Patient not taking: Reported on 09/13/2021), Disp: 90 tablet, Rfl: 1   naproxen (NAPROSYN) 500 MG tablet, Take 1 tablet (500 mg total) by mouth 2 (two) times daily with a meal. (Patient not taking: Reported on 09/13/2021), Disp: 14 tablet, Rfl: 2   naproxen sodium (ALEVE) 220 MG tablet, Take 660 mg by mouth every 6 (six) hours. (Patient not taking: Reported on 09/13/2021), Disp: , Rfl:    [START ON 11/25/2021] oxyCODONE-acetaminophen (PERCOCET) 7.5-325 MG tablet, Take 1 tablet by mouth every 8 (eight) hours as needed for moderate pain or severe pain., Disp: 90 tablet, Rfl: 0   oxyCODONE-acetaminophen (PERCOCET/ROXICET) 5-325 MG tablet, Take 1 tablet by mouth in the morning, at noon, and at bedtime. (Patient not taking: Reported on 09/13/2021), Disp: 21 tablet, Rfl: 0   Past Medical History:  Diagnosis Date   Depression    Herniated cervical disc    Neuromuscular disorder (HCC)      Assessment and Plan: 1. Chronic pain syndrome   2. Chronic bilateral low back pain with left-sided sciatica   3. Chronic, continuous use of opioids   4. Facet arthritis of lumbar region   5. Left leg weakness   6. Numbness and tingling of left leg   7. Cervicalgia   Based on our discussion today I think  is appropriate to refill his medicines for the next month dated for August 20.  No changes in his regimen will be initiated as he does seem to be doing well with this and is deriving good functional lifestyle improvement and benefit.  I am pleased that he is staying active and want him to continue with this regimen.  At this point we will have him continue follow-up with his primary care physician for baseline medical care with return to clinic scheduled in 1 month.  Follow Up  Instructions:    I discussed the assessment and treatment plan with the patient. The patient was provided an opportunity to ask questions and all were answered. The patient agreed with the plan and demonstrated an understanding of the instructions.   The patient was advised to call back or seek an in-person evaluation if the symptoms worsen or if the condition fails to improve as anticipated.  I provided 30 minutes of non-face-to-face time during this encounter.   Yevette Edwards, MD

## 2021-11-14 MED ORDER — OXYCODONE-ACETAMINOPHEN 7.5-325 MG PO TABS: 1.0000 | ORAL_TABLET | Freq: Three times a day (TID) | ORAL | 0 refills | Status: AC | PRN

## 2021-12-12 ENCOUNTER — Encounter: Payer: Self-pay | Admitting: Emergency Medicine

## 2021-12-12 ENCOUNTER — Emergency Department
Admission: EM | Admit: 2021-12-12 | Discharge: 2021-12-12 | Disposition: A | Discharge status: Home / Self Care | Payer: Medicare Other | Attending: Emergency Medicine | Admitting: Emergency Medicine

## 2021-12-12 ENCOUNTER — Other Ambulatory Visit: Payer: Self-pay

## 2021-12-12 DIAGNOSIS — S0501XA Injury of conjunctiva and corneal abrasion without foreign body, right eye, initial encounter: Secondary | ICD-10-CM | POA: Insufficient documentation

## 2021-12-12 DIAGNOSIS — X58XXXA Exposure to other specified factors, initial encounter: Secondary | ICD-10-CM | POA: Diagnosis not present

## 2021-12-12 DIAGNOSIS — S058X1A Other injuries of right eye and orbit, initial encounter: Secondary | ICD-10-CM | POA: Diagnosis present

## 2021-12-12 MED ORDER — KETOROLAC TROMETHAMINE 0.5 % OP SOLN: 1.0000 [drp] | Freq: Four times a day (QID) | OPHTHALMIC | 0 refills | Status: DC

## 2021-12-12 MED ORDER — ERYTHROMYCIN 5 MG/GM OP OINT
TOPICAL_OINTMENT | Freq: Once | OPHTHALMIC | Status: AC
Start: 2021-12-12 — End: 2021-12-12
  Administered 2021-12-12: 1 via OPHTHALMIC
  Filled 2021-12-12: qty 1

## 2021-12-12 MED ORDER — ERYTHROMYCIN 5 MG/GM OP OINT: 1.0000 | TOPICAL_OINTMENT | Freq: Four times a day (QID) | OPHTHALMIC | 0 refills | Status: AC

## 2021-12-12 MED ORDER — FLUORESCEIN SODIUM 1 MG OP STRP
1.0000 | ORAL_STRIP | Freq: Once | OPHTHALMIC | Status: AC
  Administered 2021-12-12: 1 via OPHTHALMIC
  Filled 2021-12-12: qty 1
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
## 2021-12-12 NOTE — ED Notes (Signed)
Pt discharged prior to RN assessment. Pt verbalized understanding of discharge instructions, prescriptions, and follow-up care instructions. Pt advised if symptoms worsen to return to ED. 
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
## 2021-12-12 NOTE — Discharge Instructions (Addendum)
You the ointment as prescribed.  Follow-up with ophthalmology if not improving over the next couple of days.  Return to the emergency department for symptoms that change or worsen if you are unable to schedule an appointment.

## 2021-12-12 NOTE — ED Notes (Signed)
Pt declined visual acuity screening.

## 2021-12-24 ENCOUNTER — Ambulatory Visit: Payer: Medicare Other | Admitting: Anesthesiology

## 2021-12-25 ENCOUNTER — Ambulatory Visit: Payer: Medicare Other | Attending: Anesthesiology | Admitting: Anesthesiology

## 2021-12-25 ENCOUNTER — Encounter: Payer: Self-pay | Admitting: Anesthesiology

## 2021-12-25 DIAGNOSIS — G8929 Other chronic pain: Secondary | ICD-10-CM

## 2021-12-25 DIAGNOSIS — Z79891 Long term (current) use of opiate analgesic: Secondary | ICD-10-CM

## 2021-12-25 DIAGNOSIS — G894 Chronic pain syndrome: Secondary | ICD-10-CM

## 2021-12-25 DIAGNOSIS — F1190 Opioid use, unspecified, uncomplicated: Secondary | ICD-10-CM

## 2021-12-25 DIAGNOSIS — M5442 Lumbago with sciatica, left side: Secondary | ICD-10-CM | POA: Diagnosis not present

## 2021-12-25 DIAGNOSIS — M542 Cervicalgia: Secondary | ICD-10-CM

## 2021-12-25 DIAGNOSIS — R202 Paresthesia of skin: Secondary | ICD-10-CM

## 2021-12-25 DIAGNOSIS — R29898 Other symptoms and signs involving the musculoskeletal system: Secondary | ICD-10-CM

## 2021-12-25 DIAGNOSIS — M47816 Spondylosis without myelopathy or radiculopathy, lumbar region: Secondary | ICD-10-CM

## 2021-12-25 DIAGNOSIS — R200 Anesthesia of skin: Secondary | ICD-10-CM

## 2021-12-25 MED ORDER — OXYCODONE-ACETAMINOPHEN 7.5-325 MG PO TABS: 1.0000 | ORAL_TABLET | Freq: Three times a day (TID) | ORAL | 0 refills | Status: DC | PRN

## 2021-12-25 MED ORDER — OXYCODONE-ACETAMINOPHEN 7.5-325 MG PO TABS: 1.0000 | ORAL_TABLET | Freq: Three times a day (TID) | ORAL | 0 refills | Status: AC | PRN

## 2021-12-25 NOTE — Progress Notes (Signed)
Virtual Visit via Telephone Note  I connected with Patrick [name] on 12/25/21 at  4:00 PM EDT by telephone and verified that I am speaking with the correct person using two identifiers.  Location: Patient: Home Provider: Pain control center   I discussed the limitations, risks, security and privacy concerns of performing an evaluation and management service by telephone and the availability of in person appointments. I also discussed with the patient that there may be a patient responsible charge related to this service. The patient expressed understanding and agreed to proceed.   History of Present Illness: I spoke with Mr. Patrick [name] via telephone as we were unable like for the video portion of the conference.  He reports that his low back pain and left lower extremity pain are stable in nature with no recent changes.  With his medication management at the 3 times a day dosing he is able to stay active and do his physical therapy exercises stretching strengthening and core exercises without difficulty.  He is staying active as best he can and this current regimen is working well for him he reports.  It allows him to sleep better at night and maintain his activities of daily living.  Without the medications he was unable to do this and in constant discomfort and pain.  No side effects with his medicines are noted and he denies any diverting or illicit use.  Review of systems: General: No fevers or chills Pulmonary: No shortness of breath or dyspnea Cardiac: No angina or palpitations or lightheadedness GI: No abdominal pain or constipation Psych: No depression    Observations/Objective:  Current Outpatient Medications:    [START ON 01/24/2022] oxyCODONE-acetaminophen (PERCOCET) 7.5-325 MG tablet, Take 1 tablet by mouth every 8 (eight) hours as needed for moderate pain or severe pain., Disp: 90 tablet, Rfl: 0   acetaminophen (TYLENOL) 500 MG tablet, Take 1,000 mg by mouth every 4 (four) hours as  needed., Disp: , Rfl:    ALPRAZolam (XANAX) 0.25 MG tablet, Take 1 tablet (0.25 mg total) by mouth 3 (three) times daily as needed for anxiety., Disp: 15 tablet, Rfl: 0   Capsaicin-Menthol (SALONPAS GEL EX), Apply 4 % topically as needed (applies to back)., Disp: , Rfl:    escitalopram (LEXAPRO) 5 MG tablet, START WITH 1 PILL DAILY FOR 1-2 WEEKS, THEN INCREASE TO 2 PILLS DAILY IF NO IMPROVEMENT IN ANXIETY. (Patient not taking: Reported on 09/13/2021), Disp: 90 tablet, Rfl: 1   ketorolac (ACULAR) 0.5 % ophthalmic solution, Place 1 drop into the left eye 4 (four) times daily., Disp: 5 mL, Rfl: 0   naproxen (NAPROSYN) 500 MG tablet, Take 1 tablet (500 mg total) by mouth 2 (two) times daily with a meal. (Patient not taking: Reported on 09/13/2021), Disp: 14 tablet, Rfl: 2   naproxen sodium (ALEVE) 220 MG tablet, Take 660 mg by mouth every 6 (six) hours. (Patient not taking: Reported on 09/13/2021), Disp: , Rfl:    oxyCODONE-acetaminophen (PERCOCET) 7.5-325 MG tablet, Take 1 tablet by mouth every 8 (eight) hours as needed for moderate pain or severe pain., Disp: 90 tablet, Rfl: 0   oxyCODONE-acetaminophen (PERCOCET/ROXICET) 5-325 MG tablet, Take 1 tablet by mouth in the morning, at noon, and at bedtime. (Patient not taking: Reported on 09/13/2021), Disp: 21 tablet, Rfl: 0   Past Medical History:  Diagnosis Date   Depression    Herniated cervical disc    Neuromuscular disorder ([place])      Assessment and Plan: 1. Chronic pain syndrome  2. Chronic bilateral low back pain with left-sided sciatica   3. Chronic, continuous use of opioids   4. Facet arthritis of lumbar region   5. Left leg weakness   6. Numbness and tingling of left leg   7. Cervicalgia   Based on our conversation and review of his current information it is appropriate to continue our current regimen.  I have reviewed the [facility] practitioner database information and is appropriate for refills dated for September 19 and October 19.  No  other change in his pharmacologic regimen will be initiated.  He is aware to avoid concomitant use of both his opioid medications and any benzodiazepine as he is using sparing Xanax historically for anxiety.  Continue lower back exercises with core stretching strengthening with return to clinic scheduled in 2 months and continue follow-up with his primary care physicians for baseline medical care.  Follow Up Instructions:    I discussed the assessment and treatment plan with the patient. The patient was provided an opportunity to ask questions and all were answered. The patient agreed with the plan and demonstrated an understanding of the instructions.   The patient was advised to call back or seek an in-person evaluation if the symptoms worsen or if the condition fails to improve as anticipated.  I provided 30 minutes of non-face-to-face time during this encounter.   Molli Barrows, MD

## 2021-12-31 ENCOUNTER — Encounter: Payer: Self-pay | Admitting: *Deleted

## 2022-01-11 ENCOUNTER — Telehealth: Payer: Self-pay | Admitting: Family

## 2022-01-11 NOTE — Telephone Encounter (Signed)
Copied from [place] [phone]. Topic: Medicare AWV >> Jan 11, 2022  1:59 PM Devoria Glassing wrote: Reason for CRM: LVM 01/11/22 appt time change from 10:45 to 1:45pm w/Tina

## 2022-01-14 ENCOUNTER — Ambulatory Visit: Payer: Medicare Other

## 2022-01-17 ENCOUNTER — Encounter: Payer: Self-pay | Admitting: Emergency Medicine

## 2022-01-17 ENCOUNTER — Other Ambulatory Visit: Payer: Self-pay

## 2022-01-17 ENCOUNTER — Emergency Department: Payer: Medicare Other

## 2022-01-17 ENCOUNTER — Emergency Department
Admission: EM | Admit: 2022-01-17 | Discharge: 2022-01-17 | Disposition: A | Discharge status: Home / Self Care | Payer: Medicare Other | Attending: Emergency Medicine | Admitting: Emergency Medicine

## 2022-01-17 DIAGNOSIS — S300XXA Contusion of lower back and pelvis, initial encounter: Secondary | ICD-10-CM | POA: Diagnosis not present

## 2022-01-17 DIAGNOSIS — S20229A Contusion of unspecified back wall of thorax, initial encounter: Secondary | ICD-10-CM

## 2022-01-17 DIAGNOSIS — W19XXXA Unspecified fall, initial encounter: Secondary | ICD-10-CM

## 2022-01-17 DIAGNOSIS — S3992XA Unspecified injury of lower back, initial encounter: Secondary | ICD-10-CM | POA: Diagnosis present

## 2022-01-17 DIAGNOSIS — W132XXA Fall from, out of or through roof, initial encounter: Secondary | ICD-10-CM | POA: Diagnosis not present

## 2022-01-17 MED ORDER — BACLOFEN 10 MG PO TABS: 10.0000 mg | ORAL_TABLET | Freq: Three times a day (TID) | ORAL | 0 refills | Status: AC

## 2022-01-17 MED ORDER — FENTANYL CITRATE PF 50 MCG/ML IJ SOSY
50.0000 ug | PREFILLED_SYRINGE | Freq: Once | INTRAMUSCULAR | Status: AC
  Administered 2022-01-17: 50 ug via INTRAMUSCULAR
  Filled 2022-01-17: qty 1

## 2022-01-17 MED ORDER — OXYCODONE-ACETAMINOPHEN 5-325 MG PO TABS
1.0000 | ORAL_TABLET | Freq: Once | ORAL | Status: AC
  Administered 2022-01-17: 1 via ORAL
  Filled 2022-01-17: qty 1

## 2022-01-17 MED ORDER — MELOXICAM 15 MG PO TABS: 15.0000 mg | ORAL_TABLET | Freq: Every day | ORAL | 2 refills | Status: DC

## 2022-01-17 NOTE — Discharge Instructions (Signed)
Follow up with your regular doctor  Apply ice to all areas that hurt Take the medication as prescribed We cannot prescribe you a narcotic pain medication as you received 90 oxycodone recently.  Please contact your pain management physician

## 2022-01-17 NOTE — ED Provider Triage Note (Signed)
Emergency Medicine Provider Triage Evaluation Note  Patrick [name] , a 46 y.o. male  was evaluated in triage.  Pt complains of back pain.  Fell through a roof..  Review of Systems  Positive: Fell through a roof, back pain Negative: Head injury  Physical Exam  BP 103/86 (BP Location: Left Arm)   Pulse (!) 105   Temp 98.2 F (36.8 C) (Oral)   Resp 16   Ht 6\' 2"  (1.88 m)   Wt 83.9 kg   SpO2 98%   BMI 23.75 kg/m  Gen:   Awake, no distress   Resp:  Normal effort  MSK:   Moves extremities without difficulty, able to walk but appears to be in pain, lumbar spine extremely tender Other:    Medical Decision Making  Medically screening exam initiated at 8:16 AM.  Appropriate orders placed.  Olyver Hawes was informed that the remainder of the evaluation will be completed by another provider, this initial triage assessment does not replace that evaluation, and the importance of remaining in the ED until their evaluation is complete.  Due to mechanism of injury will x-ray C-spine, T-spine and lumbar spine along with pelvis.  Patient is not complaining of foot or ankle pain   Versie Starks, PA-C 01/17/22 [phone]

## 2022-01-17 NOTE — ED Triage Notes (Signed)
Pt here with back pain that started 2 days ago. Pt states he fell 30 feet through a roof and landed on his back and has had persistent back pain since. Pt ambulatory to triage.

## 2022-01-17 NOTE — ED Provider Notes (Signed)
[facility] Provider Note    Event Date/Time   First MD Initiated Contact with Patient 01/17/22 [phone]     (approximate)   History   Back Pain   HPI  Travonne Schowalter is a 46 y.o. male presents emergency department complaining of back pain that started 2 days ago a when he fell 30 feet through a roof and landed on his back.  Continues to have back pain.  He is ambulatory.  Is on a pain contract.  Denies any loss of bowel or bladder control.  States its mostly just muscle type pain.  No numbness or tingling from the fall no head injury      Physical Exam   Triage Vital Signs: ED Triage Vitals  Enc Vitals Group     BP 01/17/22 0809 103/86     Pulse Rate 01/17/22 0809 (!) 105     Resp 01/17/22 0809 16     Temp 01/17/22 0809 98.2 F (36.8 C)     Temp Source 01/17/22 0809 Oral     SpO2 01/17/22 0809 98 %     Weight 01/17/22 0810 184 lb 15.5 oz (83.9 kg)     Height 01/17/22 0810 6\' 2"  (1.88 m)     Head Circumference --      Peak Flow --      Pain Score 01/17/22 0809 10     Pain Loc --      Pain Edu? --      Excl. in GC? --     Most recent vital signs: Vitals:   01/17/22 0809  BP: 103/86  Pulse: (!) 105  Resp: 16  Temp: 98.2 F (36.8 C)  SpO2: 98%     General: Awake, no distress.   CV:  Good peripheral perfusion. regular rate and  rhythm Resp:  Normal effort. Lungs CTA Abd:  No distention.  Nontender Other:  Lumbar spine mildly tender, patient is able to ambulate, neurovascular intact   ED Results / Procedures / Treatments   Labs (all labs ordered are listed, but only abnormal results are displayed) Labs Reviewed - No data to display   EKG  EKG   RADIOLOGY X-ray of the C-spine, T-spine, lumbar spine and pelvis    PROCEDURES:   Procedures   MEDICATIONS ORDERED IN ED: Medications  oxyCODONE-acetaminophen (PERCOCET/ROXICET) 5-325 MG per tablet 1 tablet (1 tablet Oral Given 01/17/22 0817)  fentaNYL (SUBLIMAZE) injection  50 mcg (50 mcg Intramuscular Given 01/17/22 1012)     IMPRESSION / MDM / ASSESSMENT AND PLAN / ED COURSE  I reviewed the triage vital signs and the nursing notes.                              Differential diagnosis includes, but is not limited to, fracture, contusion, strain  Patient's presentation is most consistent with acute complicated illness / injury requiring diagnostic workup.   X-rays of the C-spine, T-spine, lumbar spine and pelvis were independently reviewed and interpreted by me as being negative.  Patient was given Percocet while in triage.  Patient does take Percocet on a regular basis higher dose and states this did not help him.  He was given fentanyl 50 mcg IM.  He was discharged with prescription for meloxicam and baclofen.  He is to return the emergency department worsening.  Apply ice to all areas that hurt.  Patient is in agreement treatment plan.  He was discharged  stable condition.      FINAL CLINICAL IMPRESSION(S) / ED DIAGNOSES   Final diagnoses:  Fall, initial encounter  Contusion of back, unspecified laterality, initial encounter     Rx / DC Orders   ED Discharge Orders          Ordered    meloxicam (MOBIC) 15 MG tablet  Daily        01/17/22 1003    baclofen (LIORESAL) 10 MG tablet  3 times daily        01/17/22 1003             Note:  This document was prepared using Dragon voice recognition software and may include unintentional dictation errors.    Versie Starks, PA-C 01/17/22 1200    Carrie Mew, MD 01/22/22 1047

## 2022-01-17 NOTE — ED Notes (Signed)
See triage note  Presents with lower back pain  States he did take a fall 2 days ago

## 2022-03-21 ENCOUNTER — Encounter: Payer: Self-pay | Admitting: *Deleted

## 2022-08-12 ENCOUNTER — Emergency Department
Admission: EM | Admit: 2022-08-12 | Discharge: 2022-08-12 | Discharge status: Against Medical Advice | Payer: Medicare Other

## 2022-08-12 NOTE — ED Triage Notes (Signed)
Pt walked out of lobby doors. Unknown if he is returning.

## 2022-10-28 ENCOUNTER — Other Ambulatory Visit: Payer: Self-pay

## 2022-10-28 ENCOUNTER — Emergency Department
Admission: EM | Admit: 2022-10-28 | Discharge: 2022-10-28 | Discharge status: Against Medical Advice | Payer: Medicare Other | Source: Home / Self Care | Attending: Emergency Medicine | Admitting: Emergency Medicine

## 2022-10-28 DIAGNOSIS — Z5329 Procedure and treatment not carried out because of patient's decision for other reasons: Secondary | ICD-10-CM | POA: Insufficient documentation

## 2022-10-28 DIAGNOSIS — K0889 Other specified disorders of teeth and supporting structures: Secondary | ICD-10-CM | POA: Diagnosis not present

## 2022-10-28 NOTE — ED Notes (Signed)
See triage notes. Patient is complaining of upper left dental pain since Friday.

## 2022-10-28 NOTE — ED Triage Notes (Signed)
Pt to ED for upper left dental pain since Friday. Denies fevers.

## 2022-10-28 NOTE — ED Provider Notes (Signed)
[facility] Provider Note  Patient Contact: 5:12 PM (approximate)   History   No chief complaint on file.   HPI  Patrick [name] is a 47 y.o. male who presents the emergency department complaining of dental pain.  Patient states has been ongoing for several days.  Has been taking over-the-counter medications with no relief.  Fevers, chills.  History of recurrent infections.  Patient denies any difficulty swallowing or breathing at this time.     Physical Exam   Triage Vital Signs: ED Triage Vitals  Encounter Vitals Group     BP 10/28/22 1538 123/87     Systolic BP Percentile --      Diastolic BP Percentile --      Pulse Rate 10/28/22 1538 76     Resp 10/28/22 1538 12     Temp 10/28/22 1538 98.6 F (37 C)     Temp Source 10/28/22 1538 Oral     SpO2 10/28/22 1538 97 %     Weight 10/28/22 1541 185 lb (83.9 kg)     Height 10/28/22 1541 6\' 2"  (1.88 m)     Head Circumference --      Peak Flow --      Pain Score 10/28/22 1540 10     Pain Loc --      Pain Education --      Exclude from Growth Chart --     Most recent vital signs: Vitals:   10/28/22 1538  BP: 123/87  Pulse: 76  Resp: 12  Temp: 98.6 F (37 C)  SpO2: 97%     General: Alert and in no acute distress. ENT:      Ears:       Nose: No congestion/rhinnorhea.      Mouth/Throat: Mucous membranes are moist.  No gross oropharyngeal erythema or edema.  Slight erythema along the gumline adjacent to area of pain.  No appreciable drainable fluid collection.  No evidence of submandibular erythema, edema Neck: No stridor. No cervical spine tenderness to palpation.  No erythema or edema to the anterior neck. Hematological/Lymphatic/Immunilogical: No cervical lymphadenopathy. Cardiovascular:  Good peripheral perfusion Respiratory: Normal respiratory effort without tachypnea or retractions. Lungs CTAB. Gastrointestinal: Bowel sounds 4 quadrants. Soft and nontender to palpation. No guarding or  rigidity. No palpable masses. No distention. No CVA tenderness. Musculoskeletal: Full range of motion to all extremities.  Neurologic:  No gross focal neurologic deficits are appreciated.  Skin:   No rash noted Other:   ED Results / Procedures / Treatments   Labs (all labs ordered are listed, but only abnormal results are displayed) Labs Reviewed - No data to display   EKG     RADIOLOGY    No results found.  PROCEDURES:  Critical Care performed: No  Procedures   MEDICATIONS ORDERED IN ED: Medications - No data to display   IMPRESSION / MDM / ASSESSMENT AND PLAN / ED COURSE  I reviewed the triage vital signs and the nursing notes.                                 Differential diagnosis includes, but is not limited to, dental infection, dental abscess, broken dentition, sialoadenitis, Ludwick's angina  Patient eloped prior to diagnosis and discharge.  Note:  This document was prepared using Dragon voice recognition software and may include unintentional dictation errors.   Racheal Patches, PA-C 10/28/22 1737  Jene Every, MD 10/28/22 1754

## 2022-11-01 ENCOUNTER — Other Ambulatory Visit: Payer: Self-pay

## 2022-11-01 ENCOUNTER — Emergency Department
Admission: EM | Admit: 2022-11-01 | Discharge: 2022-11-01 | Discharge status: Against Medical Advice | Payer: Medicare Other | Source: Home / Self Care

## 2022-11-01 ENCOUNTER — Encounter: Payer: Self-pay | Admitting: Emergency Medicine

## 2022-11-01 DIAGNOSIS — K0889 Other specified disorders of teeth and supporting structures: Secondary | ICD-10-CM | POA: Diagnosis present

## 2022-11-01 DIAGNOSIS — Z5321 Procedure and treatment not carried out due to patient leaving prior to being seen by health care provider: Secondary | ICD-10-CM | POA: Insufficient documentation

## 2022-11-01 NOTE — ED Triage Notes (Signed)
Patient ambulatory to triage with steady gait, without difficulty or distress noted; pt reports left upper dental pain x wk; here 7/22 but st left due to long wait prior to being seen

## 2022-11-08 ENCOUNTER — Emergency Department
Admission: EM | Admit: 2022-11-08 | Discharge: 2022-11-08 | Disposition: A | Discharge status: Home / Self Care | Payer: Medicare Other | Attending: Emergency Medicine | Admitting: Emergency Medicine

## 2022-11-08 ENCOUNTER — Encounter: Payer: Self-pay | Admitting: Emergency Medicine

## 2022-11-08 ENCOUNTER — Other Ambulatory Visit: Payer: Self-pay

## 2022-11-08 DIAGNOSIS — K0889 Other specified disorders of teeth and supporting structures: Secondary | ICD-10-CM | POA: Insufficient documentation

## 2022-11-08 MED ORDER — AMOXICILLIN-POT CLAVULANATE 875-125 MG PO TABS: 1.0000 | ORAL_TABLET | Freq: Two times a day (BID) | ORAL | 0 refills | Status: AC

## 2022-11-08 MED ORDER — LIDOCAINE VISCOUS HCL 2 % MT SOLN
15.0000 mL | Freq: Once | OROMUCOSAL | Status: AC
  Administered 2022-11-08: 15 mL via OROMUCOSAL
  Filled 2022-11-08: qty 15

## 2022-11-08 MED ORDER — AMOXICILLIN-POT CLAVULANATE 875-125 MG PO TABS
1.0000 | ORAL_TABLET | Freq: Once | ORAL | Status: AC
  Administered 2022-11-08: 1 via ORAL
  Filled 2022-11-08: qty 1

## 2022-11-08 NOTE — ED Notes (Signed)
Pt verbalizes understanding of discharge instructions, reports will follow up with dentist

## 2022-11-08 NOTE — Discharge Instructions (Signed)
Take antibiotic as directed.  Follow-up with one of the clinics listed below for definitive management of your ill feeling partial.  OPTIONS FOR DENTAL FOLLOW UP CARE  [place] [facility] Health and Human Services - Local Safety Net Dental Clinics [email or web address]   [facility] [phone])  Patrick [name] [phone])  [place] [phone])  [facility] [phone])  [facility] [phone]) This clinic caters to the indigent population and is on a lottery system. Location: [employer] [facility], [employer], [address] [place] Clinic Hours: Wednesdays from 6pm - 9pm, patients seen by a lottery system. For dates, call or go to [email or web address] Services: Cleanings, fillings and simple extractions. Payment Options: DENTAL WORK IS FREE OF CHARGE. Bring proof of income or support. Best way to get seen: Arrive at 5:15 pm - this is a lottery, NOT first come/first serve, so arriving earlier will not increase your chances of being seen.     [facility] [phone] Select option 1 for emergencies   Location: [facility], [place], [address] [place] Clinic Hours: No walk-ins accepted - call the day before to schedule an appointment. Check in times are 9:30 am and 1:30 pm. Services: Simple extractions, temporary fillings, pulpectomy/pulp debridement, uncomplicated abscess drainage. Payment Options: PAYMENT IS DUE AT THE TIME OF SERVICE.  Fee is usually $100-200, additional surgical procedures (e.g. abscess drainage) may be extra. Cash, checks, Visa/MasterCard accepted.  Can file Medicaid if patient is covered for dental - patient should call case worker to check. No discount for [facility] patients. Best way to get seen: MUST call the day before and get onto  the schedule. Can usually be seen the next 1-2 days. No walk-ins accepted.     [facility] [phone]   Location: [facility], [address], [place] Clinic Hours: M, W, Th, F 8am or 1:30pm, Tues 9a or 1:30 - first come/first served. Services: Simple extractions, temporary fillings, uncomplicated abscess drainage.  You do not need to be an [facility] resident. Payment Options: PAYMENT IS DUE AT THE TIME OF SERVICE. Dental insurance, otherwise sliding scale - bring proof of income or support. Depending on income and treatment needed, cost is usually $50-200. Best way to get seen: Arrive early as it is first come/first served.     [facility] [phone]   Location: [address] Clinic Hours: Mon-Thu 8a-5p Services: Most basic dental services including extractions and fillings. Payment Options: PAYMENT IS DUE AT THE TIME OF SERVICE. Sliding scale, up to 50% off - bring proof if income or support. Medicaid with dental option accepted. Best way to get seen: Call to schedule an appointment, can usually be seen within 2 weeks OR they will try to see walk-ins - show up at 8a or 2p (you may have to wait).     [facility] [phone] [place] RESIDENTS ONLY   Location: [facility], [address], [place], [place] [address] Clinic Hours: By appointment only. Monday - Thursday 8am-5pm, Friday 8am-12pm Services: Cleanings, fillings, extractions. Payment Options: PAYMENT IS DUE AT THE TIME OF SERVICE. Cash, Visa or MasterCard. Sliding scale - $30 minimum per service. Best way to get seen: Come in to office, complete packet and make an appointment - need proof of income or support monies for each household member and proof of [facility] residence. Usually takes  about a month to get in.     [facility] [phone]    Location: [address], [facility] Clinic Hours: Walk-in Urgent Care Dental Services are offered Monday-Friday mornings only. The numbers of emergencies accepted daily is limited to the number of providers available. Maximum 15 - Mondays, Wednesdays & Thursdays Maximum 10 - Tuesdays & Fridays Services: You do not need to be a [facility] resident to be seen for a dental emergency. Emergencies are defined as pain, swelling, abnormal bleeding, or dental trauma. Walkins will receive x-rays if needed. NOTE: Dental cleaning is not an emergency. Payment Options: PAYMENT IS DUE AT THE TIME OF SERVICE. Minimum co-pay is $40.00 for uninsured patients. Minimum co-pay is $3.00 for Medicaid with dental coverage. Dental Insurance is accepted and must be presented at time of visit. Medicare does not cover dental. Forms of payment: Cash, credit card, checks. Best way to get seen: If not previously registered with the clinic, walk-in dental registration begins at 7:15 am and is on a first come/first serve basis. If previously registered with the clinic, call to make an appointment.     [facility] [phone] [place] RESIDENTS ONLY   Location: [address], [place], [place] Clinic Hours: Mon-Thu 10a-2p Services: Extractions only! Payment Options: FREE (donations accepted) - bring proof of income or support Best way to get seen: Call and schedule an appointment OR come at 8am on the 1st Monday of every month (except for holidays) when it is first come/first served.     [facility] [phone]   Location: [address] [place], [place] Clinic Hours: Friday mornings Services, Payment Options, Best way to get seen: Call for info

## 2022-11-08 NOTE — ED Triage Notes (Signed)
  Patient comes in with dental pain.  Patient states its the top, left, rear molar.  Has been seen for the same previously but unable to see dentist.  Taking tylenol and ibuprofen for pain.  Pain 10/10.

## 2022-11-08 NOTE — ED Provider Notes (Signed)
[facility] Emergency Department Provider Note     Event Date/Time   First MD Initiated Contact with Patient 11/08/22 2250     (approximate)   History   Dental Pain   HPI  Patrick [name] is a 47 y.o. male with a noncontributory medical history, presents to the ED for evaluation of acute on chronic dental pain.  Patient reports pain to the top of his palate.  He localizes pain due to his ill fitting partial upper denture.  Patient reports taken Tylenol ibuprofen for the pain but denies any significant benefit.  No fevers, chills, sweats, chest pain, shortness breath reported.  He denies any gum swelling or focal purulent drainage.  No difficulty breathing, or controlling oral secretions.  Physical Exam   Triage Vital Signs: ED Triage Vitals  Encounter Vitals Group     BP 11/08/22 2137 118/76     Systolic BP Percentile --      Diastolic BP Percentile --      Pulse Rate 11/08/22 2137 67     Resp 11/08/22 2137 20     Temp 11/08/22 2137 98.2 F (36.8 C)     Temp Source 11/08/22 2137 Oral     SpO2 11/08/22 2137 96 %     Weight 11/08/22 2137 185 lb (83.9 kg)     Height 11/08/22 2137 6\' 2"  (1.88 m)     Head Circumference --      Peak Flow --      Pain Score 11/08/22 2147 10     Pain Loc --      Pain Education --      Exclude from Growth Chart --     Most recent vital signs: Vitals:   11/08/22 2137  BP: 118/76  Pulse: 67  Resp: 20  Temp: 98.2 F (36.8 C)  SpO2: 96%    General Awake, no distress. NAD HEENT NCAT. PERRL. EOMI. No rhinorrhea. Mucous membranes are moist.  Patient with some hard palate irritation and erythema noted in the same outline of his partial upper denture.  No focal gum swelling is appreciated.  Uvula is midline and tonsils are flat.  No proximal edema is noted. CV:  Good peripheral perfusion. RRR RESP:  Normal effort. CTA ABD:  No distention.    ED Results / Procedures / Treatments   Labs (all labs ordered are  listed, but only abnormal results are displayed) Labs Reviewed - No data to display   EKG   RADIOLOGY  No results found.   PROCEDURES:  Critical Care performed: No  Procedures   MEDICATIONS ORDERED IN ED: Medications  amoxicillin-clavulanate (AUGMENTIN) 875-125 MG per tablet 1 tablet (has no administration in time range)     IMPRESSION / MDM / ASSESSMENT AND PLAN / ED COURSE  I reviewed the triage vital signs and the nursing notes.                              Differential diagnosis includes, but is not limited to, enteral abscess, dental infection, ill-fitting dentures,  Patient's presentation is most consistent with acute, uncomplicated illness.  Patient's diagnosis is consistent with dentalgia secondary to ill 15 dentures or partial.  No evidence of any focal dental infection or rest dental caries.  Patient will have her be discharged with a prescription for Augmentin as requested.  Patient is to follow up with one of the dental providers on the list given,  as needed or otherwise directed. Patient is given ED precautions to return to the ED for any worsening or new symptoms.  FINAL CLINICAL IMPRESSION(S) / ED DIAGNOSES   Final diagnoses:  Dentalgia     Rx / DC Orders   ED Discharge Orders          Ordered    amoxicillin-clavulanate (AUGMENTIN) 875-125 MG tablet  2 times daily        11/08/22 2324             Note:  This document was prepared using Dragon voice recognition software and may include unintentional dictation errors.    Lissa Hoard, PA-C 11/13/22 Rexene Edison, MD 11/14/22 [phone]

## 2022-12-19 ENCOUNTER — Emergency Department
Admission: EM | Admit: 2022-12-19 | Discharge: 2022-12-19 | Disposition: A | Discharge status: Home / Self Care | Payer: Medicare Other

## 2022-12-19 NOTE — ED Notes (Signed)
Pt called x 2 to be triaged with no answer

## 2023-01-18 ENCOUNTER — Other Ambulatory Visit: Payer: Self-pay

## 2023-01-18 ENCOUNTER — Emergency Department
Admission: EM | Admit: 2023-01-18 | Discharge: 2023-01-18 | Discharge status: Against Medical Advice | Payer: Medicare Other | Attending: Emergency Medicine | Admitting: Emergency Medicine

## 2023-01-18 DIAGNOSIS — F419 Anxiety disorder, unspecified: Secondary | ICD-10-CM | POA: Diagnosis present

## 2023-01-18 DIAGNOSIS — Z5321 Procedure and treatment not carried out due to patient leaving prior to being seen by health care provider: Secondary | ICD-10-CM | POA: Insufficient documentation

## 2023-01-18 NOTE — ED Triage Notes (Signed)
Pt presents to ER c/o anxiety attack that started "this morning when I left my son at the hospital."  Pt reports hx of anxiety and PTSD, but does not take any meds at this time.  Pt denies pain, but states he feels like his eyes are going to pop out of his head.  Pt is otherwise A&O x4 and in NAD at this time.

## 2023-01-18 NOTE — ED Notes (Signed)
Following triage being completed, this writer instructed pt to have seat in lobby while we wait for a room to open up.  Pt got up from chair, threw his BP cuff on the ground, and walked out the lobby door while expressing frustration about having to wait.  Pt seen walking up to parking lot.  NAD noted.

## 2023-04-05 IMAGING — MR MR LUMBAR SPINE W/O CM
5 series · 31 of 48 positions shown · non-contrast
Comparison: None.

CLINICAL DATA: Low back pain

EXAM:
MRI LUMBAR SPINE WITHOUT CONTRAST
TECHNIQUE: Multiplanar, multisequence MR imaging of the lumbar spine was
performed. No intravenous contrast was administered.

[Series 5: T2 · sagittal · 4.0mm · 0.81mm/px · 6 of 17 slices shown (1 of 2)]
[im 1/17]
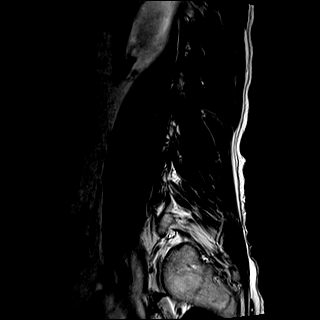
[im 4/17]
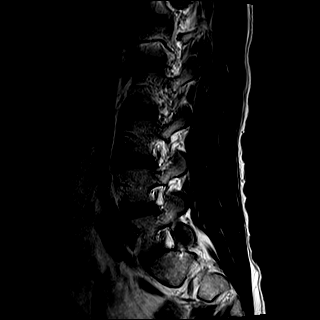
[im 7/17]
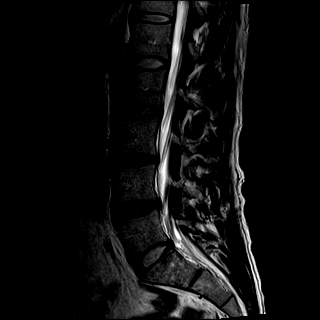
[im 10/17]
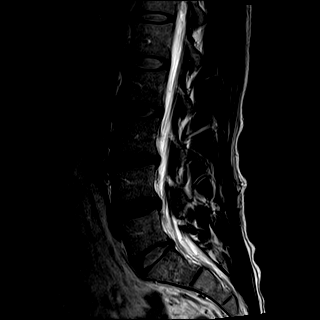
[im 13/17]
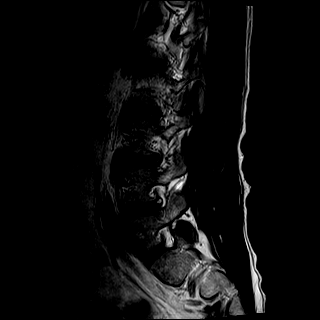
[im 17/17]
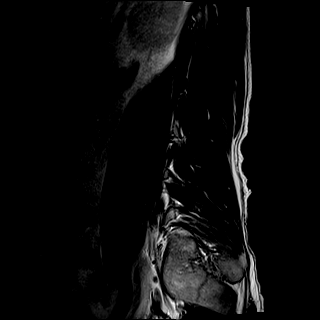

[Series 6: T1 · sagittal · 4.0mm · 0.81mm/px · 6 of 17 slices shown (1 of 2)]
[im 1/17]
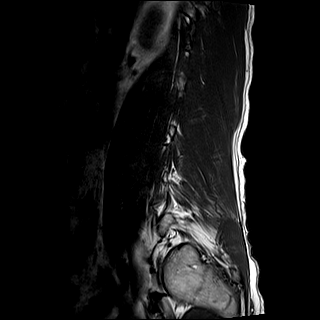
[im 4/17]
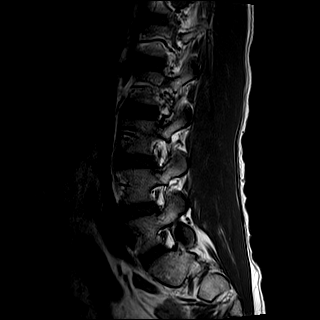
[im 7/17]
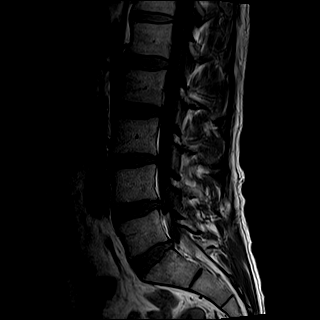
[im 10/17]
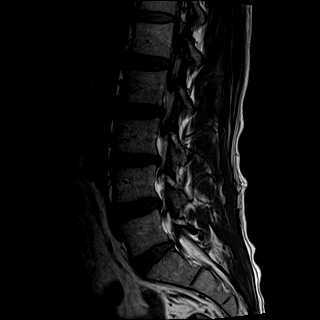
[im 13/17]
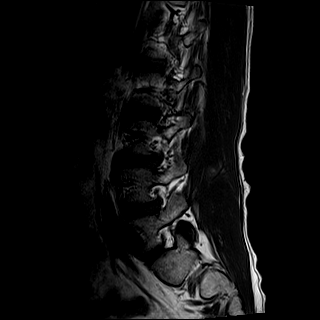
[im 17/17]
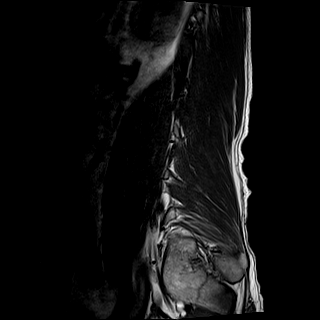

[Series 7: STIR · sagittal · 4.0mm · 0.41mm/px · 1 of 17 slices shown]
[im 1/17]
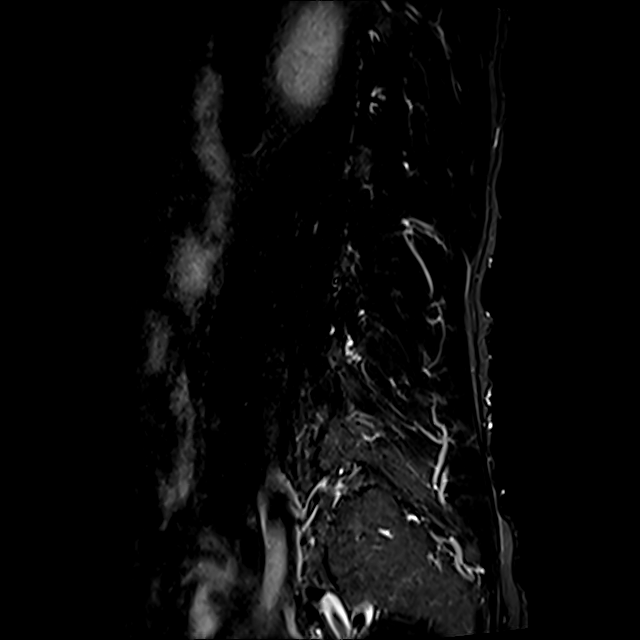

[Series 8: T2 · axial · 4.0mm · 0.78mm/px · z∈[-163,+78]mm · 9 of 39 slices shown (2 of 2)]
[im 1/39]
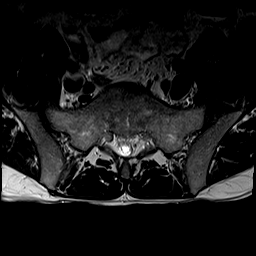
[im 6/39]
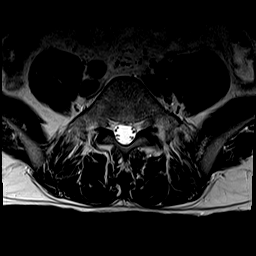
[im 11/39]
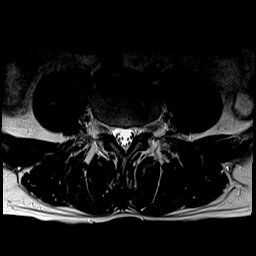
[im 17/39]
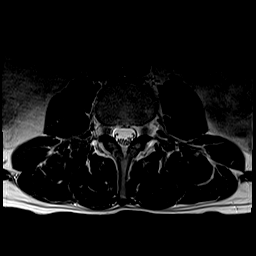
[im 20/39]
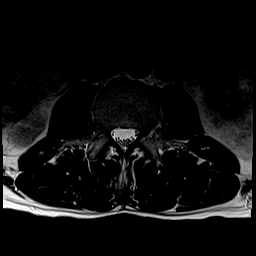
[im 22/39]
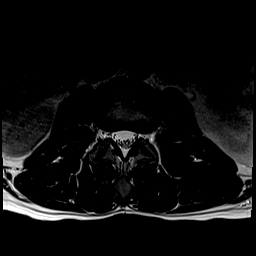
[im 28/39]
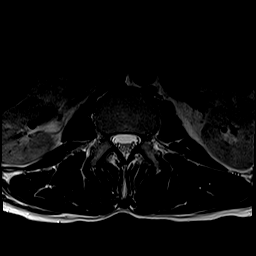
[im 33/39]
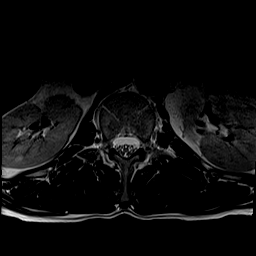
[im 39/39]
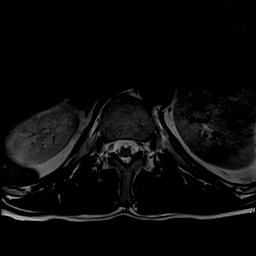

[Series 9: T1 · axial · 4.0mm · 0.39mm/px · z∈[-163,+78]mm · 9 of 39 slices shown (2 of 2)]
[im 1/39]
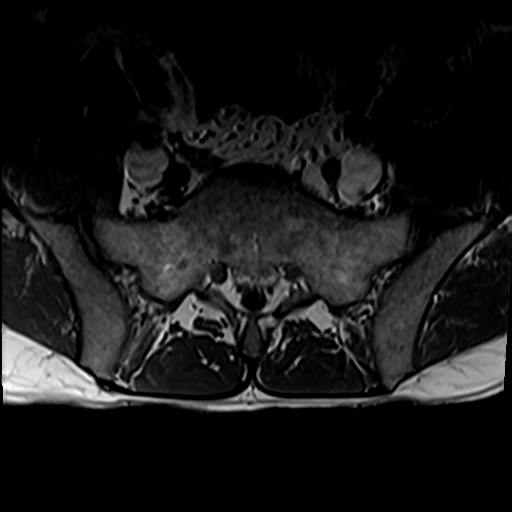
[im 6/39]
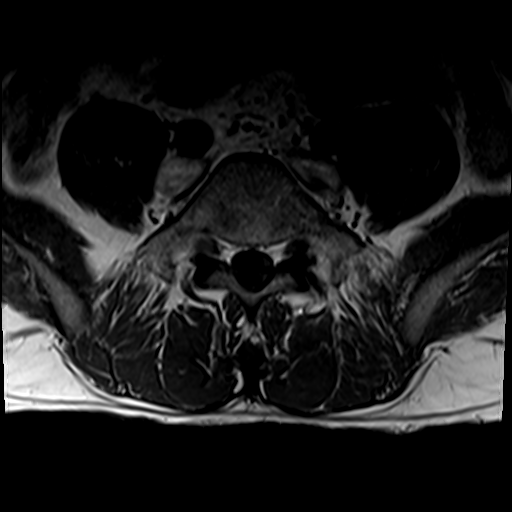
[im 11/39]
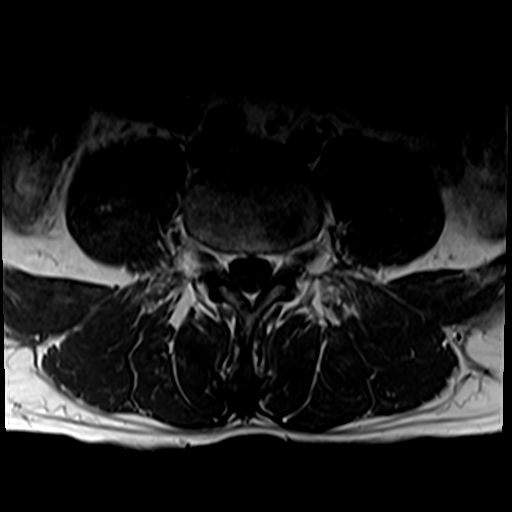
[im 17/39]
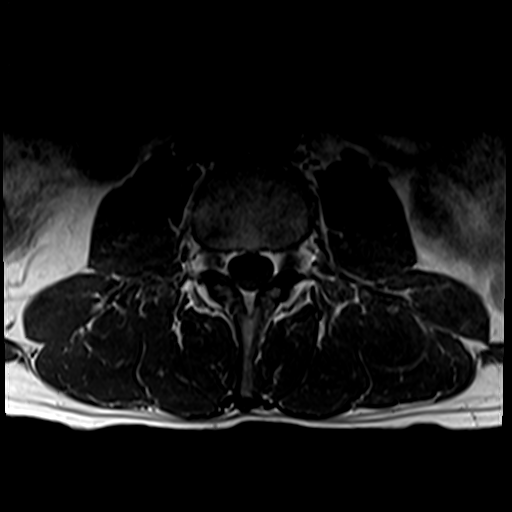
[im 20/39]
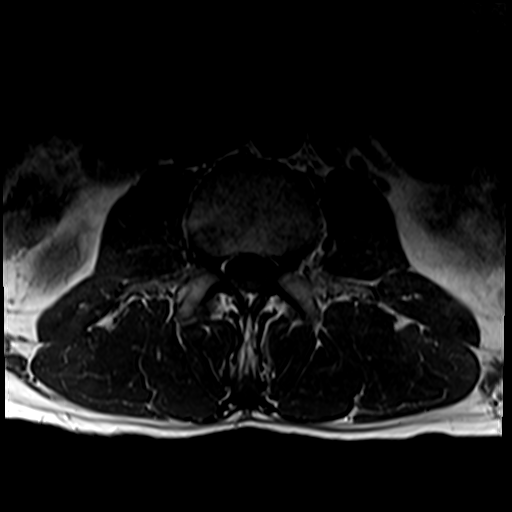
[im 22/39]
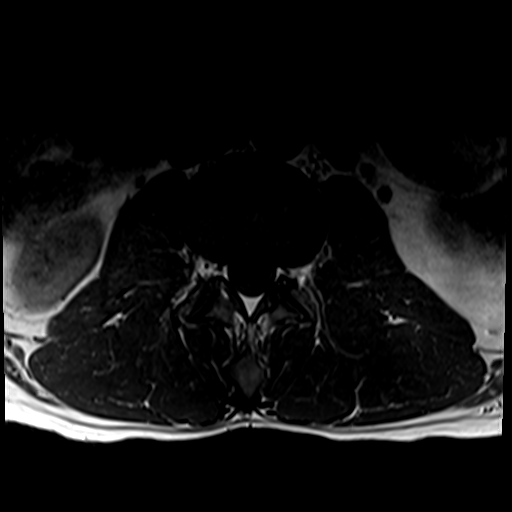
[im 28/39]
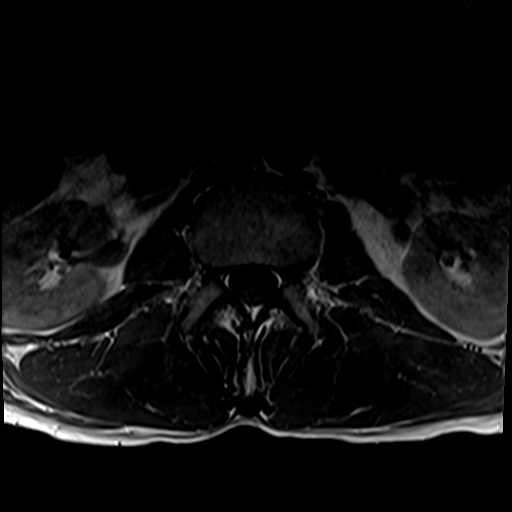
[im 33/39]
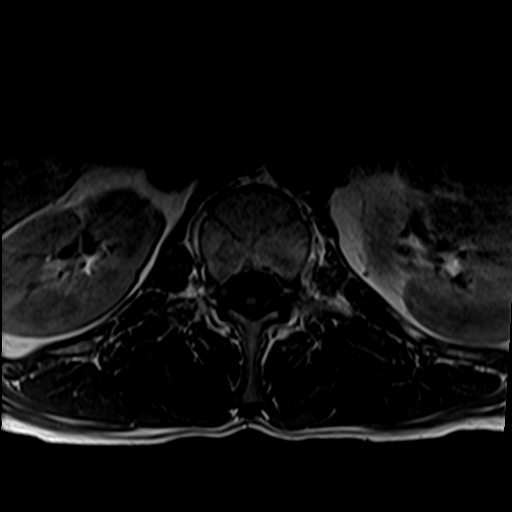
[im 39/39]
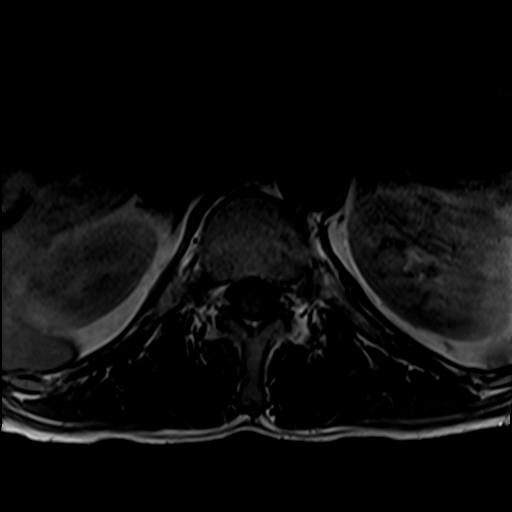

[31 of 48 positions shown; findings below may reference images not displayed]

FINDINGS: Segmentation:  Standard

Alignment:  Normal

Vertebrae:  Normal

Conus medullaris and cauda equina: Conus extends to the L1 level.
Conus and cauda equina appear normal.

Paraspinal and other soft tissues: Normal

Disc levels:

Disc levels from T12-L3 are normal.

L3-4: Small left extraforaminal disc protrusion with annular fissure
in close proximity to the exiting L3 nerve root.

L4-5: Disc desiccation with small bulge. No spinal canal stenosis.
There is an annular fissure in close proximity to the foraminal
portion of the left L4 nerve root.

L5-S1: Normal.
IMPRESSION: 1. Small left extraforaminal disc protrusion at L3-L4 with annular
fissure in close proximity to the exiting L3 nerve root.
2. Small bulge at L4-L5 with annular fissure in close proximity to
the foraminal portion of the left L4 nerve root.
3. No spinal canal stenosis.

## 2023-04-30 ENCOUNTER — Emergency Department
Admission: EM | Admit: 2023-04-30 | Discharge: 2023-04-30 | Disposition: A | Discharge status: Home / Self Care | Payer: Medicare Other | Attending: Emergency Medicine | Admitting: Emergency Medicine

## 2023-04-30 ENCOUNTER — Other Ambulatory Visit: Payer: Self-pay

## 2023-04-30 DIAGNOSIS — F410 Panic disorder [episodic paroxysmal anxiety] without agoraphobia: Secondary | ICD-10-CM | POA: Insufficient documentation

## 2023-04-30 MED ORDER — LORAZEPAM 1 MG PO TABS
1.0000 mg | ORAL_TABLET | Freq: Once | ORAL | Status: DC
  Filled 2023-04-30: qty 1

## 2023-04-30 MED ORDER — ALPRAZOLAM 0.5 MG PO TABS
0.5000 mg | ORAL_TABLET | Freq: Once | ORAL | Status: AC
  Administered 2023-04-30: 0.5 mg via ORAL
  Filled 2023-04-30: qty 1

## 2023-04-30 MED ORDER — ALPRAZOLAM 0.5 MG PO TABS: 0.5000 mg | ORAL_TABLET | Freq: Three times a day (TID) | ORAL | 0 refills | Status: AC | PRN

## 2023-04-30 NOTE — ED Notes (Signed)
Pt reports feeling a lot better after xanax

## 2023-04-30 NOTE — ED Provider Notes (Signed)
[facility] Provider Note    Event Date/Time   First MD Initiated Contact with Patient 04/30/23 [phone]     (approximate)   History   Panic Attack   HPI  Patrick [name] is a 48 y.o. male with history of depression, anxiety who presents to the emergency department with a panic attack that he states started 30 minutes ago.  He denies anything that exacerbated the symptoms but feels very anxious, restless.  No SI or HI.  Denies drug or alcohol use.  States previously took Xanax for panic attacks but no longer has any prescription medication at home.  Last prescription for Xanax was in 2023.  States his wife drove him to the emergency department.   History provided by patient.    Past Medical History:  Diagnosis Date   Depression    Herniated cervical disc    Neuromuscular disorder (HCC)     History reviewed. No pertinent surgical history.  MEDICATIONS:  Prior to Admission medications   Medication Sig Start Date End Date Taking? Authorizing Provider  acetaminophen (TYLENOL) 500 MG tablet Take 1,000 mg by mouth every 4 (four) hours as needed.    [provider]  Capsaicin-Menthol (SALONPAS GEL EX) Apply 4 % topically as needed (applies to back).    [provider]    Physical Exam   Triage Vital Signs: ED Triage Vitals  Encounter Vitals Group     BP 04/30/23 0442 122/83     Systolic BP Percentile --      Diastolic BP Percentile --      Pulse Rate 04/30/23 0442 86     Resp 04/30/23 0442 20     Temp 04/30/23 0442 97.9 F (36.6 C)     Temp Source 04/30/23 0442 Oral     SpO2 04/30/23 0442 99 %     Weight 04/30/23 0441 180 lb (81.6 kg)     Height 04/30/23 0441 6\' 2"  (1.88 m)     Head Circumference --      Peak Flow --      Pain Score 04/30/23 0441 0     Pain Loc --      Pain Education --      Exclude from Growth Chart --     Most recent vital signs: Vitals:   04/30/23 0442  BP: 122/83  Pulse: 86  Resp: 20  Temp: 97.9 F  (36.6 C)  SpO2: 99%    CONSTITUTIONAL: Alert, responds appropriately to questions.  Appears anxious HEAD: Normocephalic, atraumatic EYES: Conjunctivae clear, pupils appear equal, sclera nonicteric ENT: normal nose; moist mucous membranes NECK: Supple, normal ROM CARD: RRR; S1 and S2 appreciated RESP: Normal chest excursion without splinting or tachypnea; breath sounds clear and equal bilaterally; no wheezes, no rhonchi, no rales, no hypoxia or respiratory distress, speaking full sentences ABD/GI: Non-distended; soft, non-tender, no rebound, no guarding, no peritoneal signs BACK: The back appears normal EXT: Normal ROM in all joints; no deformity noted, no edema SKIN: Normal color for age and race; warm; no rash on exposed skin NEURO: Moves all extremities equally, normal speech, ambulates with normal gait PSYCH: Appears anxious.  Denies SI or HI.   ED Results / Procedures / Treatments   LABS: (all labs ordered are listed, but only abnormal results are displayed) Labs Reviewed - No data to display   EKG:  EKG Interpretation Date/Time:    Ventricular Rate:    PR Interval:    QRS Duration:  QT Interval:    QTC Calculation:   R Axis:      Text Interpretation:           RADIOLOGY: My personal review and interpretation of imaging:    I have personally reviewed all radiology reports.   No results found.   PROCEDURES:  Critical Care performed: No     Procedures    IMPRESSION / MDM / ASSESSMENT AND PLAN / ED COURSE  I reviewed the triage vital signs and the nursing notes.    Patient here with panic attack.  History of the same.  Previously on Xanax which he states has helped him.     DIFFERENTIAL DIAGNOSIS (includes but not limited to):   Panic attack, doubt psychiatric emergency, doubt ACS, PE, dissection   Patient's presentation is most consistent with acute, uncomplicated illness.   PLAN: Will give patient a dose of Xanax here as he states  this has helped him previously.  No SI or HI.  Not responding to internal stimuli.  No indication for emergent psychiatric evaluation.  No medical complaints other than feeling anxious.   MEDICATIONS GIVEN IN ED: Medications  ALPRAZolam Prudy Feeler) tablet 0.5 mg (0.5 mg Oral Given 04/30/23 0503)     ED COURSE: Patient reports feeling much better.  He states it is "night and day".  No longer restless or anxious in appearance.  Will discharge with outpatient follow-up and a prescription for Xanax.   At this time, I do not feel there is any life-threatening condition present. I reviewed all nursing notes, vitals, pertinent previous records.  All lab and urine results, EKGs, imaging ordered have been independently reviewed and interpreted by myself.  I reviewed all available radiology reports from any imaging ordered this visit.  Based on my assessment, I feel the patient is safe to be discharged home without further emergent workup and can continue workup as an outpatient as needed. Discussed all findings, treatment plan as well as usual and customary return precautions.  They verbalize understanding and are comfortable with this plan.  Outpatient follow-up has been provided as needed.  All questions have been answered.    CONSULTS:  none   OUTSIDE RECORDS REVIEWED: Reviewed patient's last pain management note on 12/25/2021 for chronic back pain.       FINAL CLINICAL IMPRESSION(S) / ED DIAGNOSES   Final diagnoses:  Panic attack     Rx / DC Orders   ED Discharge Orders          Ordered    ALPRAZolam (XANAX) 0.5 MG tablet  3 times daily PRN        04/30/23 0552             Note:  This document was prepared using Dragon voice recognition software and may include unintentional dictation errors.   Andrea Colglazier, Layla Maw, DO 04/30/23 [phone]

## 2023-04-30 NOTE — ED Triage Notes (Signed)
Pt reports panic attack that started a few hours ago, pt states he has hx of same and used to take xanax for them but hasn't had one in awhile so he no longer has the medication. Pt cannot identify what brought on panic attack tonight.

## 2024-03-20 ENCOUNTER — Other Ambulatory Visit: Payer: Self-pay

## 2024-03-20 ENCOUNTER — Emergency Department
Admission: EM | Admit: 2024-03-20 | Discharge: 2024-03-21 | Disposition: A | Discharge status: Home / Self Care | Attending: Emergency Medicine | Admitting: Emergency Medicine

## 2024-03-20 ENCOUNTER — Encounter: Payer: Self-pay | Admitting: *Deleted

## 2024-03-20 DIAGNOSIS — F419 Anxiety disorder, unspecified: Secondary | ICD-10-CM | POA: Insufficient documentation

## 2024-03-20 DIAGNOSIS — F410 Panic disorder [episodic paroxysmal anxiety] without agoraphobia: Secondary | ICD-10-CM | POA: Insufficient documentation

## 2024-03-20 MED ORDER — LORAZEPAM 1 MG PO TABS
1.0000 mg | ORAL_TABLET | Freq: Once | ORAL | Status: DC
  Filled 2024-03-20: qty 1

## 2024-03-20 NOTE — ED Triage Notes (Signed)
 Pt says that he is having an anxiety attack that started 30 minutes ago. Didn't take any medications for the same. Says he used to take alprazolam , but has not had an anxiety attack in a long time. Denies SI.

## 2024-03-20 NOTE — ED Provider Notes (Signed)
 [facility] Provider Note    Event Date/Time   First MD Initiated Contact with Patient 03/20/24 2358     (approximate)   History   Panic Attack   HPI  Patrick [name] is a 48 y.o. male who presents to the ED for evaluation of Panic Attack   Patient with self-reported history of anxiety presents to the ED with reports of a panic attack.  He reports a lot going on at home and describes his mother being diagnosed with breast cancer, mastectomy and he is helping care for her.  He denies any thoughts of self-harm or hallucinations but reports his thoughts were racing and he did not know what else to do   Physical Exam   Triage Vital Signs: ED Triage Vitals [03/20/24 2355]  Encounter Vitals Group     BP 110/78     Girls Systolic BP Percentile      Girls Diastolic BP Percentile      Boys Systolic BP Percentile      Boys Diastolic BP Percentile      Pulse Rate 81     Resp 16     Temp 98.6 F (37 C)     Temp Source Oral     SpO2 99 %     Weight      Height      Head Circumference      Peak Flow      Pain Score 8     Pain Loc      Pain Education      Exclude from Growth Chart     Most recent vital signs: Vitals:   03/20/24 2355  BP: 110/78  Pulse: 81  Resp: 16  Temp: 98.6 F (37 C)  SpO2: 99%    General: Awake, no distress.  CV:  Good peripheral perfusion.  Resp:  Normal effort.  Abd:  No distention.  MSK:  No deformity noted.  Neuro:  No focal deficits appreciated. Other:     ED Results / Procedures / Treatments   Labs (all labs ordered are listed, but only abnormal results are displayed) Labs Reviewed - No data to display  EKG   RADIOLOGY   Official radiology report(s): No results found.  PROCEDURES and INTERVENTIONS:  Procedures  Medications  ALPRAZolam  (XANAX ) tablet 1 mg (1 mg Oral Given 03/21/24 0006)     IMPRESSION / MDM / ASSESSMENT AND PLAN / ED COURSE  I reviewed the triage vital signs and the nursing  notes.  Differential diagnosis includes, but is not limited to, panic attack, malingering, polysubstance abuse, suicidal, psychoses  {Patient presents with symptoms of an acute illness or injury that is potentially life-threatening.  Patient presents to the ED with signs of a panic attack suitable for outpatient management.  Provide oral benzodiazepine with resolution of symptoms.  Linear thoughts, well-appearing and no indications for emergent psychiatric evaluation, IVC or additional workup at this point.  Discharged with hydroxyzine  prescription.  Clinical Course as of 03/21/24 0032  [place] Mar 21, 2024  0028 Patient feeling much better, appreciative and requesting discharge.  Discussed hydroxyzine  as needed as an outpatient and ED return precautions.  His son will drive him home, he is feels safe [DS]    Clinical Course User Index [DS] Claudene Rover, MD     FINAL CLINICAL IMPRESSION(S) / ED DIAGNOSES   Final diagnoses:  Anxiety  Panic attack     Rx / DC Orders   ED Discharge Orders  Ordered    hydrOXYzine  (ATARAX ) 25 MG tablet  3 times daily PRN        03/21/24 0028             Note:  This document was prepared using Dragon voice recognition software and may include unintentional dictation errors.   Claudene Rover, MD 03/21/24 [phone]

## 2024-03-21 DIAGNOSIS — F410 Panic disorder [episodic paroxysmal anxiety] without agoraphobia: Secondary | ICD-10-CM | POA: Diagnosis not present

## 2024-03-21 MED ORDER — ALPRAZOLAM 0.5 MG PO TABS
1.0000 mg | ORAL_TABLET | Freq: Once | ORAL | Status: AC
  Administered 2024-03-21: 1 mg via ORAL
  Filled 2024-03-21: qty 2

## 2024-03-21 MED ORDER — HYDROXYZINE HCL 25 MG PO TABS: 25.0000 mg | ORAL_TABLET | Freq: Three times a day (TID) | ORAL | 1 refills | Status: AC | PRN
